# Patient Record
Sex: Male | Born: 1990 | Race: Black or African American | Hispanic: No | Marital: Single | Smoking: Current every day smoker
Health system: Southern US, Community
[De-identification: ages and names within clinical notes are randomized; demographics above are authoritative.]

## PROBLEM LIST (undated history)

## (undated) DIAGNOSIS — F419 Anxiety disorder, unspecified: Secondary | ICD-10-CM

## (undated) DIAGNOSIS — N159 Renal tubulo-interstitial disease, unspecified: Secondary | ICD-10-CM

## (undated) DIAGNOSIS — J3089 Other allergic rhinitis: Secondary | ICD-10-CM

## (undated) DIAGNOSIS — G9050 Complex regional pain syndrome I, unspecified: Secondary | ICD-10-CM

## (undated) HISTORY — PX: NO PAST SURGERIES: SHX2092

---
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
## 2012-04-04 ENCOUNTER — Emergency Department: Payer: Self-pay | Admitting: Emergency Medicine

## 2012-04-06 LAB — BETA STREP CULTURE(ARMC)

## 2012-09-19 ENCOUNTER — Emergency Department: Payer: Self-pay | Admitting: Emergency Medicine

## 2013-03-08 ENCOUNTER — Emergency Department: Payer: Self-pay | Admitting: Internal Medicine

## 2013-04-02 DIAGNOSIS — N159 Renal tubulo-interstitial disease, unspecified: Secondary | ICD-10-CM

## 2013-04-02 HISTORY — DX: Renal tubulo-interstitial disease, unspecified: N15.9

## 2013-12-30 ENCOUNTER — Emergency Department: Payer: Self-pay | Admitting: Emergency Medicine

## 2013-12-30 LAB — CBC WITH DIFFERENTIAL/PLATELET
BASOS PCT: 0.5 %
Basophil #: 0 10*3/uL (ref 0.0–0.1)
EOS ABS: 0.2 10*3/uL (ref 0.0–0.7)
Eosinophil %: 1.7 %
HCT: 50.1 % (ref 40.0–52.0)
HGB: 16.3 g/dL (ref 13.0–18.0)
LYMPHS PCT: 17.1 %
Lymphocyte #: 1.6 10*3/uL (ref 1.0–3.6)
MCH: 27.1 pg (ref 26.0–34.0)
MCHC: 32.6 g/dL (ref 32.0–36.0)
MCV: 83 fL (ref 80–100)
MONOS PCT: 11.7 %
Monocyte #: 1.1 x10 3/mm — ABNORMAL HIGH (ref 0.2–1.0)
NEUTROS ABS: 6.5 10*3/uL (ref 1.4–6.5)
Neutrophil %: 69 %
Platelet: 233 10*3/uL (ref 150–440)
RBC: 6.01 10*6/uL — ABNORMAL HIGH (ref 4.40–5.90)
RDW: 14.4 % (ref 11.5–14.5)
WBC: 9.5 10*3/uL (ref 3.8–10.6)

## 2013-12-30 LAB — COMPREHENSIVE METABOLIC PANEL
ALK PHOS: 79 U/L
ANION GAP: 6 — AB (ref 7–16)
Albumin: 4.2 g/dL (ref 3.4–5.0)
BILIRUBIN TOTAL: 0.5 mg/dL (ref 0.2–1.0)
BUN: 14 mg/dL (ref 7–18)
CHLORIDE: 109 mmol/L — AB (ref 98–107)
CO2: 26 mmol/L (ref 21–32)
Calcium, Total: 9.9 mg/dL (ref 8.5–10.1)
Creatinine: 1.68 mg/dL — ABNORMAL HIGH (ref 0.60–1.30)
EGFR (African American): >60
GFR CALC NON AF AMER: 56 — AB
GLUCOSE: 99 mg/dL (ref 65–99)
OSMOLALITY: 282 (ref 275–301)
POTASSIUM: 4.3 mmol/L (ref 3.5–5.1)
SGOT(AST): 36 U/L (ref 15–37)
SGPT (ALT): 42 U/L
Sodium: 141 mmol/L (ref 136–145)
Total Protein: 8 g/dL (ref 6.4–8.2)

## 2013-12-30 LAB — URINALYSIS, COMPLETE
Bacteria: NONE SEEN
Bilirubin,UR: NEGATIVE
Blood: NEGATIVE
Glucose,UR: NEGATIVE mg/dL (ref 0–75)
KETONE: NEGATIVE
LEUKOCYTE ESTERASE: NEGATIVE
NITRITE: NEGATIVE
PH: 6 (ref 4.5–8.0)
PROTEIN: NEGATIVE
RBC,UR: <1 /HPF (ref 0–5)
SQUAMOUS EPITHELIAL: NONE SEEN
Specific Gravity: 1.002 (ref 1.003–1.030)
WBC UR: NONE SEEN /HPF (ref 0–5)

## 2013-12-31 ENCOUNTER — Observation Stay: Payer: Self-pay | Admitting: Internal Medicine

## 2013-12-31 LAB — COMPREHENSIVE METABOLIC PANEL
ALBUMIN: 3.6 g/dL (ref 3.4–5.0)
AST: 25 U/L (ref 15–37)
Alkaline Phosphatase: 63 U/L
Anion Gap: 4 — ABNORMAL LOW (ref 7–16)
BUN: 13 mg/dL (ref 7–18)
Bilirubin,Total: 0.5 mg/dL (ref 0.2–1.0)
CHLORIDE: 107 mmol/L (ref 98–107)
CREATININE: 1.39 mg/dL — AB (ref 0.60–1.30)
Calcium, Total: 8.8 mg/dL (ref 8.5–10.1)
Co2: 29 mmol/L (ref 21–32)
EGFR (African American): >60
GLUCOSE: 97 mg/dL (ref 65–99)
OSMOLALITY: 279 (ref 275–301)
POTASSIUM: 3.8 mmol/L (ref 3.5–5.1)
SGPT (ALT): 30 U/L
SODIUM: 140 mmol/L (ref 136–145)
TOTAL PROTEIN: 6.9 g/dL (ref 6.4–8.2)

## 2013-12-31 LAB — CBC
HCT: 45.8 % (ref 40.0–52.0)
HGB: 15.2 g/dL (ref 13.0–18.0)
MCH: 27.8 pg (ref 26.0–34.0)
MCHC: 33.2 g/dL (ref 32.0–36.0)
MCV: 84 fL (ref 80–100)
Platelet: 218 10*3/uL (ref 150–440)
RBC: 5.47 10*6/uL (ref 4.40–5.90)
RDW: 14.3 % (ref 11.5–14.5)
WBC: 7.7 10*3/uL (ref 3.8–10.6)

## 2013-12-31 LAB — URINALYSIS, COMPLETE
BACTERIA: NONE SEEN
BILIRUBIN, UR: NEGATIVE
Blood: NEGATIVE
Glucose,UR: NEGATIVE mg/dL (ref 0–75)
KETONE: NEGATIVE
LEUKOCYTE ESTERASE: NEGATIVE
Nitrite: NEGATIVE
Ph: 5 (ref 4.5–8.0)
Protein: NEGATIVE
SPECIFIC GRAVITY: 1.015 (ref 1.003–1.030)

## 2013-12-31 LAB — LIPASE, BLOOD: Lipase: 69 U/L — ABNORMAL LOW (ref 73–393)

## 2014-01-01 LAB — CBC WITH DIFFERENTIAL/PLATELET
BASOS PCT: 0.7 %
Basophil #: 0 10*3/uL (ref 0.0–0.1)
Eosinophil #: 0.3 10*3/uL (ref 0.0–0.7)
Eosinophil %: 3.8 %
HCT: 44.8 % (ref 40.0–52.0)
HGB: 14.1 g/dL (ref 13.0–18.0)
Lymphocyte #: 3.4 10*3/uL (ref 1.0–3.6)
Lymphocyte %: 48.3 %
MCH: 26.8 pg (ref 26.0–34.0)
MCHC: 31.4 g/dL — AB (ref 32.0–36.0)
MCV: 85 fL (ref 80–100)
MONO ABS: 0.5 x10 3/mm (ref 0.2–1.0)
Monocyte %: 6.7 %
Neutrophil #: 2.8 10*3/uL (ref 1.4–6.5)
Neutrophil %: 40.5 %
PLATELETS: 202 10*3/uL (ref 150–440)
RBC: 5.26 10*6/uL (ref 4.40–5.90)
RDW: 14.4 % (ref 11.5–14.5)
WBC: 6.9 10*3/uL (ref 3.8–10.6)

## 2014-01-01 LAB — BASIC METABOLIC PANEL
Anion Gap: 5 — ABNORMAL LOW (ref 7–16)
BUN: 10 mg/dL (ref 7–18)
CALCIUM: 8.6 mg/dL (ref 8.5–10.1)
CO2: 29 mmol/L (ref 21–32)
CREATININE: 1.37 mg/dL — AB (ref 0.60–1.30)
Chloride: 107 mmol/L (ref 98–107)
EGFR (African American): >60
GLUCOSE: 81 mg/dL (ref 65–99)
OSMOLALITY: 279 (ref 275–301)
Potassium: 4 mmol/L (ref 3.5–5.1)
Sodium: 141 mmol/L (ref 136–145)

## 2014-01-01 LAB — URINE CULTURE

## 2014-01-01 LAB — LIPID PANEL
CHOLESTEROL: 111 mg/dL (ref 0–200)
HDL Cholesterol: 27 mg/dL — ABNORMAL LOW (ref 40–60)
Ldl Cholesterol, Calc: 65 mg/dL (ref 0–100)
Triglycerides: 93 mg/dL (ref 0–200)
VLDL CHOLESTEROL, CALC: 19 mg/dL (ref 5–40)

## 2014-01-01 LAB — HEMOGLOBIN A1C: Hemoglobin A1C: 6.1 % (ref 4.2–6.3)

## 2014-01-01 LAB — MAGNESIUM: Magnesium: 2 mg/dL

## 2014-01-02 LAB — URINE CULTURE

## 2014-05-09 ENCOUNTER — Emergency Department: Payer: Self-pay | Admitting: Emergency Medicine

## 2014-07-24 NOTE — H&P (Signed)
PATIENT NAME:  Jason Collier, Jason Collier MR#:  [id number] DATE OF BIRTH:  10-Nov-1990  DATE OF ADMISSION:  12/31/2013  PRIMARY CARE PHYSICIAN: Nonlocal.  REFERRING PHYSICIAN: Jolanta B. Pucilowska, MD  CHIEF COMPLAINT: Right flank pain with nausea and vomiting for 2 days.  HISTORY OF PRESENT ILLNESS: A 24 year old [place] American male with no past medical history who presented to the ED with the above chief complaint. The patient is alert, awake, oriented, in no acute distress. The patient said he started to have right-sided abdominal pain, radiation to the right flank and gradually right flank pain became worse. In addition, the patient has nausea and vomiting yesterday. The patient came to the ED. He got a CAT scan which showed pyelonephritis. However, the patient's urinalysis was negative. The patient was discharged with Cipro and oxycodone. However, after going home, the patient's right flank pain has become worse. In addition, the patient had multiple times of nausea, vomiting and unable to take p.o. medication, so the patient came back for further evaluation. Since the patient cannot take p.o. medication, Dr. Andi Devon admitted the patient for IV antibiotics and treatment.   PAST MEDICAL HISTORY: None.   PAST SURGICAL HISTORY: None.  FAMILY HISTORY: None.   ALLERGIES: None.   HOME MEDICATIONS: Oxycodone 5 mg p.o. every 6 hours p.r.n., Levaquin 750 mg p.o. daily.   REVIEW OF SYSTEMS: CONSTITUTIONAL: The patient has fever, chills. No headache or dizziness. No weakness.  EYES: No double vision or blurry vision.  EARS, NOSE AND THROAT: No postnasal drip, slurred speech or dysphagia.  CARDIOVASCULAR: No chest pain, palpitation, orthopnea or nocturnal dyspnea. No leg edema.  PULMONARY: No cough, sputum, shortness of breath or hemoptysis.  GASTROINTESTINAL: Positive for abdominal pain in the right flank pain and nausea, vomiting, but no diarrhea. No melena or bloody stool.  GENITOURINARY: No  dysuria, hematuria or incontinence.  SKIN: No rash or jaundice.  NEUROLOGIC: No syncope, loss of consciousness or seizure.  ENDOCRINE: No polyuria, polydipsia, heat or cold intolerance.  HEMATOLOGY: No easy bruising or bleeding.   PHYSICAL EXAMINATION:  VITAL SIGNS: Temperature is 97.6, blood pressure 125/73, pulse 52, oxygen saturation 100% on room air.  GENERAL: The patient is alert, awake, oriented, in no acute distress.  HEENT: Pupils are equal, reactive to light and accommodation. Moist oral mucosa. Clear oropharynx.  NECK: Supple. No JVD or carotid bruits. No lymphadenopathy. No thyromegaly.  CARDIOVASCULAR: S1, S2, regular rate and rhythm. No murmurs or gallops.  PULMONARY: Bilateral air entry. No wheezing or rales. No use of accessory muscle to breathe.  ABDOMEN: Soft. No distention. Some mild tenderness on the right middle part of the abdomen. No rigidity. No rebound. Right flank tenderness and right side of the back tenderness. No organomegaly.  EXTREMITIES: No edema, clubbing or cyanosis. No calf tenderness. Bilateral pedal pulses present.  SKIN: No rash or jaundice.  NEUROLOGY: A and O x 3. No focal deficit. Power 5/5. Sensation intact.   LABORATORY DATA: Urinalysis showed WBC 4, RBC less than 1, nitrite IS negative. Glucose 97, BUN 13, creatinine 1.39. Electrolytes normal. WBC 7.3, hemoglobin 15.2, platelets 218,000. Lipase 69.   CAT scan of abdomen and pelvis yesterday showed a subtle area of suspected nephrotic inhomogeneous raising question of infection or pyelonephritis.   Abdominal ultrasound showed no definite acute abnormality.   IMPRESSIONS:  1.  Questionable pyelonephritis.  2.  Acute kidney injury due to dehydration.   PLAN OF TREATMENT:  1.  The patient will be placed for observation. We  will start Rocephin IV with Zofran p.r.n. and pain medication p.r.n.  2.  Give IV fluid support. Follow up BMP.   I discussed the patient's condition and plan of treatment with  the patient.   TIME SPENT: About 52 minutes.    ____________________________ Shaune PollackQing Makella Buckingham, MD qc:TT D: 12/31/2013 21:53:13 ET T: 12/31/2013 22:40:31 ET JOB#: [id number]  cc: Shaune PollackQing Patton Swisher, MD, <Dictator> Shaune PollackQING Margarita Bobrowski MD ELECTRONICALLY SIGNED 01/01/2014 15:42

## 2014-07-24 NOTE — Discharge Summary (Signed)
PATIENT NAME:  Jason Collier, Jason Collier MR#:  [id number] DATE OF BIRTH:  03/09/1991  DATE OF ADMISSION:  12/31/2013 DATE OF DISCHARGE:  01/01/2014  DISCHARGE DIAGNOSES:  1.  Pyelonephritis.  2.  Ongoing tobacco abuse.  3.  Nausea and vomiting due to pyelonephritis.   CONSULTATIONS: None.   PROCEDURES: 1.  CT abdomen and pelvis performed 12/30/2013, shows subtle area of a suspected nephrographic inhomogeneity, raising question of infection/pyelonephritis.  2.  Ultrasound of the abdomen September 30 shows no definite acute abnormality in the right upper quadrant of the abdomen. Incidental note made of increasing echogenicity of  renal parenchyma suggesting medical renal disease.   HISTORY OF PRESENT ILLNESS: This very pleasant, 24 year old African American male with no past medical history presented to the Emergency Room with complaint of right flank pain, nausea, and vomiting for 2 days. He initially presented to the Emergency Room 2 days prior with right flank pain. A CT scan showed pyelonephritis suspected. He was discharged on levofloxacin and oxycodone. Upon going home, his flank pain became worse and he developed nausea and vomiting. He was unable to take his p.o. medications so he returns for further treatment.   HOSPITAL COURSE BY PROBLEM:  1.  Pyelonephritis: Unusual in that his urinalysis has been negative x 2 and urine cultures show 3000 colony-forming units of gram-positive cocci, colonies are too small to identify further. Repeat urine culture is negative. He seemed to respond well to 24 hours of IV Rocephin. Nausea was controlled with Zofran. His requirement for oxycodone was low. He is discharged on Ceftin and Zofran. Has a prior prescription for oxycodone at home and this was not repeated. He understands that she needs to follow up with a primary care physician due to this unusual presentation. He was given information on the [facility]. At the time of discharge, he is asymptomatic, no  pain, no nausea or vomiting.  2.  Ongoing tobacco abuse: Counseling was provided on smoking cessation. A nicotine patch was provided during hospitalization. The patient states that he is not ready to quit.  3.  Nausea and vomiting: This was likely due to #1 and has now resolved. The patient is tolerating full meals and has a prescription for Zofran in case symptoms recur.   DISCHARGE MEDICATIONS: 1.  Ceftin 250 mg 1 tablet orally twice a day for 8 days.  2.  Zofran 4 mg 1 tablet every 8 hours as needed for nausea and vomiting.  3.  Oxycodone 5 mg oral tablet 1 tablet every 6 hours as needed for pain. Note that this was not refilled during this admission, as he had a prior prescription from the Emergency Room visit 3 days before.   DISCHARGE PHYSICAL EXAMINATION: VITAL SIGNS: Temperature 98.3, heart rate 54, blood pressure 119/75, respirations 20, oxygenation 93% on room air.  GENERAL: Alert, oriented. No distress.  CARDIOVASCULAR: Regular rate and rhythm, no murmurs, rubs, or gallops.  PULMONARY: Lungs are clear to auscultation with good air movement.  ABDOMEN: Soft, nontender, nondistended. Bowel sounds are normal.  MUSCULOSKELETAL: There is some tenderness with percussion of the right costophrenic angle.  SKIN: No skin changes or rash noted.  EXTREMITIES: No edema, 2+ pulses.  NEUROLOGIC: Nonfocal.  PSYCHIATRIC: Alert, oriented, good insight.   LABORATORY DATA: Sodium 141, potassium 4.0, chloride 104, bicarbonate 29, BUN 10, creatinine 1.37. GFR is greater than 60. LDL is 65. Magnesium 2.0.  Total cholesterol 111, HDL 27, lipase 69. Hemoglobin A1c 6.1. LFTs are normal. White blood cell  count 6.9, hemoglobin 14.1, platelets 202,000, MCV 85. Urine culture is as described above. Urinalysis with 4 white blood cells per high-powered field.   CONDITION: Stable.   DISPOSITION: The patient is being discharged home.   DIET: No restrictions.   ACTIVITY: No restrictions.   FOLLOWUP:  The  patient is to follow up in the next month with [facility] or find another primary care physician.   TIME SPENT ON DISCHARGE: 35 minutes.    ____________________________ Ena Dawley. Clent Ridges, MD cpw:at D: 01/02/2014 11:36:14 ET T: 01/02/2014 19:26:24 ET JOB#: [id number]  cc: Ena Dawley. Clent Ridges, MD, <Dictator> Gale Journey MD ELECTRONICALLY SIGNED 01/06/2014 13:33

## 2014-08-08 ENCOUNTER — Other Ambulatory Visit: Payer: Self-pay

## 2014-08-08 ENCOUNTER — Emergency Department: Payer: 59

## 2014-08-08 ENCOUNTER — Emergency Department
Admission: EM | Admit: 2014-08-08 | Discharge: 2014-08-09 | Disposition: A | Discharge status: Home / Self Care | Payer: 59 | Attending: Emergency Medicine | Admitting: Emergency Medicine

## 2014-08-08 ENCOUNTER — Encounter: Payer: Self-pay | Admitting: *Deleted

## 2014-08-08 DIAGNOSIS — M549 Dorsalgia, unspecified: Secondary | ICD-10-CM | POA: Diagnosis not present

## 2014-08-08 DIAGNOSIS — M7918 Myalgia, other site: Secondary | ICD-10-CM

## 2014-08-08 DIAGNOSIS — R109 Unspecified abdominal pain: Secondary | ICD-10-CM | POA: Insufficient documentation

## 2014-08-08 DIAGNOSIS — Z720 Tobacco use: Secondary | ICD-10-CM | POA: Insufficient documentation

## 2014-08-08 LAB — CBC WITH DIFFERENTIAL/PLATELET
BASOS ABS: 0 10*3/uL (ref 0–0.1)
Basophils Relative: 1 %
Eosinophils Absolute: 0.1 10*3/uL (ref 0–0.7)
Eosinophils Relative: 3 %
HCT: 46.5 % (ref 40.0–52.0)
HEMOGLOBIN: 15.7 g/dL (ref 13.0–18.0)
Lymphocytes Relative: 48 %
Lymphs Abs: 2.8 10*3/uL (ref 1.0–3.6)
MCH: 28.2 pg (ref 26.0–34.0)
MCHC: 33.8 g/dL (ref 32.0–36.0)
MCV: 83.3 fL (ref 80.0–100.0)
MONOS PCT: 8 %
Monocytes Absolute: 0.5 10*3/uL (ref 0.2–1.0)
NEUTROS ABS: 2.3 10*3/uL (ref 1.4–6.5)
Neutrophils Relative %: 40 %
PLATELETS: 221 10*3/uL (ref 150–440)
RBC: 5.58 MIL/uL (ref 4.40–5.90)
RDW: 15.9 % — ABNORMAL HIGH (ref 11.5–14.5)
WBC: 5.8 10*3/uL (ref 3.8–10.6)

## 2014-08-08 LAB — LIPASE, BLOOD: LIPASE: 23 U/L (ref 22–51)

## 2014-08-08 LAB — COMPREHENSIVE METABOLIC PANEL
ALBUMIN: 4.5 g/dL (ref 3.5–5.0)
ALK PHOS: 62 U/L (ref 38–126)
ALT: 37 U/L (ref 17–63)
ANION GAP: 9 (ref 5–15)
AST: 30 U/L (ref 15–41)
BUN: 13 mg/dL (ref 6–20)
CO2: 25 mmol/L (ref 22–32)
Calcium: 9.6 mg/dL (ref 8.9–10.3)
Chloride: 107 mmol/L (ref 101–111)
Creatinine, Ser: 1.18 mg/dL (ref 0.61–1.24)
GFR calc non Af Amer: >60 mL/min (ref >60)
Glucose, Bld: 92 mg/dL (ref 65–99)
POTASSIUM: 4.2 mmol/L (ref 3.5–5.1)
SODIUM: 141 mmol/L (ref 135–145)
Total Bilirubin: 0.5 mg/dL (ref 0.3–1.2)
Total Protein: 7.6 g/dL (ref 6.5–8.1)

## 2014-08-08 LAB — TROPONIN I: Troponin I: <0.03 ng/mL (ref <0.031)

## 2014-08-08 MED ORDER — ONDANSETRON HCL 4 MG/2ML IJ SOLN
INTRAMUSCULAR | Status: AC
  Administered 2014-08-08: 4 mg via INTRAVENOUS
  Filled 2014-08-08: qty 2

## 2014-08-08 MED ORDER — IOHEXOL 300 MG/ML  SOLN
100.0000 mL | Freq: Once | INTRAMUSCULAR | Status: AC | PRN
  Administered 2014-08-08: 100 mL via INTRAVENOUS

## 2014-08-08 MED ORDER — HYDROMORPHONE HCL 1 MG/ML IJ SOLN
1.0000 mg | Freq: Once | INTRAMUSCULAR | Status: AC
Start: 2014-08-08 — End: 2014-08-08
  Administered 2014-08-08: 1 mg via INTRAVENOUS

## 2014-08-08 MED ORDER — HYDROMORPHONE HCL 1 MG/ML IJ SOLN
INTRAMUSCULAR | Status: AC
  Filled 2014-08-08: qty 1

## 2014-08-08 MED ORDER — SODIUM CHLORIDE 0.9 % IV BOLUS (SEPSIS)
1000.0000 mL | Freq: Once | INTRAVENOUS | Status: AC
  Administered 2014-08-08: 1000 mL via INTRAVENOUS

## 2014-08-08 MED ORDER — OXYCODONE-ACETAMINOPHEN 5-325 MG PO TABS
ORAL_TABLET | ORAL | Status: AC
  Administered 2014-08-08: 1 via ORAL
  Filled 2014-08-08: qty 1

## 2014-08-08 MED ORDER — IOHEXOL 240 MG/ML SOLN
25.0000 mL | Freq: Once | INTRAMUSCULAR | Status: AC | PRN
  Administered 2014-08-08: 25 mL via ORAL

## 2014-08-08 MED ORDER — OXYCODONE-ACETAMINOPHEN 5-325 MG PO TABS
1.0000 | ORAL_TABLET | Freq: Once | ORAL | Status: AC
Start: 2014-08-08 — End: 2014-08-08
  Administered 2014-08-08: 1 via ORAL

## 2014-08-08 NOTE — ED Notes (Signed)
Pt states R back/flank pain x 1 week, worsening today at 1830. Pt states pain radiating from R mid-back to R chest/RUQ.  Pt denies urinary sxs.

## 2014-08-09 LAB — URINALYSIS COMPLETE WITH MICROSCOPIC (ARMC ONLY)
BACTERIA UA: NONE SEEN
Bilirubin Urine: NEGATIVE
GLUCOSE, UA: NEGATIVE mg/dL
Hgb urine dipstick: NEGATIVE
Ketones, ur: NEGATIVE mg/dL
LEUKOCYTES UA: NEGATIVE
NITRITE: NEGATIVE
Protein, ur: NEGATIVE mg/dL
RBC / HPF: NONE SEEN RBC/hpf (ref 0–5)
SPECIFIC GRAVITY, URINE: 1.056 — AB (ref 1.005–1.030)
pH: 5 (ref 5.0–8.0)

## 2014-08-09 MED ORDER — OXYCODONE-ACETAMINOPHEN 5-325 MG PO TABS: 1.0000 | ORAL_TABLET | Freq: Four times a day (QID) | ORAL | Status: DC | PRN

## 2014-08-09 MED ORDER — NAPROXEN 500 MG PO TABS: 500.0000 mg | ORAL_TABLET | Freq: Two times a day (BID) | ORAL | Status: DC

## 2014-08-09 MED ORDER — OXYCODONE-ACETAMINOPHEN 5-325 MG PO TABS: 1.0000 | ORAL_TABLET | Freq: Once | ORAL | Status: DC

## 2014-08-09 MED ORDER — OXYCODONE-ACETAMINOPHEN 5-325 MG PO TABS
1.0000 | ORAL_TABLET | Freq: Once | ORAL | Status: AC
  Administered 2014-08-09: 1 via ORAL

## 2014-08-09 MED ORDER — OXYCODONE-ACETAMINOPHEN 5-325 MG PO TABS
ORAL_TABLET | ORAL | Status: AC
  Filled 2014-08-09: qty 1

## 2014-08-09 NOTE — Discharge Instructions (Signed)
Musculoskeletal Pain Musculoskeletal pain is muscle and boney aches and pains. These pains can occur in any part of the body. Your caregiver may treat you without knowing the cause of the pain. They may treat you if blood or urine tests, X-rays, and other tests were normal.  CAUSES There is often not a definite cause or reason for these pains. These pains may be caused by a type of germ (virus). The discomfort may also come from overuse. Overuse includes working out too hard when your body is not fit. Boney aches also come from weather changes. Bone is sensitive to atmospheric pressure changes. HOME CARE INSTRUCTIONS   Ask when your test results will be ready. Make sure you get your test results.  Only take over-the-counter or prescription medicines for pain, discomfort, or fever as directed by your caregiver. If you were given medications for your condition, do not drive, operate machinery or power tools, or sign legal documents for 24 hours. Do not drink alcohol. Do not take sleeping pills or other medications that may interfere with treatment.  Continue all activities unless the activities cause more pain. When the pain lessens, slowly resume normal activities. Gradually increase the intensity and duration of the activities or exercise.  During periods of severe pain, bed rest may be helpful. Lay or sit in any position that is comfortable.  Putting ice on the injured area.  Put ice in a bag.  Place a towel between your skin and the bag.  Leave the ice on for 15 to 20 minutes, 3 to 4 times a day.  Follow up with your caregiver for continued problems and no reason can be found for the pain. If the pain becomes worse or does not go away, it may be necessary to repeat tests or do additional testing. Your caregiver may need to look further for a possible cause. SEEK IMMEDIATE MEDICAL CARE IF:  You have pain that is getting worse and is not relieved by medications.  You develop chest pain  that is associated with shortness or breath, sweating, feeling sick to your stomach (nauseous), or throw up (vomit).  Your pain becomes localized to the abdomen.  You develop any new symptoms that seem different or that concern you. MAKE SURE YOU:   Understand these instructions.  Will watch your condition.  Will get help right away if you are not doing well or get worse. Document Released: 03/19/2005 Document Revised: 06/11/2011 Document Reviewed: 11/21/2012 [facility] Patient Information 2015 [employer], [place]. This information is not intended to replace advice given to you by your health care provider. Make sure you discuss any questions you have with your health care provider.  Your blood and urine tests and CT scan of the abdomen were unremarkable. Take pain medication as prescribed and follow-up with her doctor for continued monitoring of your symptoms.  Take Percocet as prescribed. Do not drink alcohol, drive or participate in any other potentially dangerous activities while taking this medication as it may make you sleepy. Do not take this medication with any other sedating medications, either prescription or over-the-counter. If you were prescribed Percocet or Vicodin, do not take these with acetaminophen (Tylenol) as it is already contained within these medications.   This medication is an opiate (or narcotic) pain medication and can be habit forming.  Use it as little as possible to achieve adequate pain control.  Do not use or use it with extreme caution if you have a history of opiate abuse or dependence.  If you are on a pain contract with your primary care doctor or a pain specialist, be sure to let them know you were prescribed this medication today from the [facility].  This medication is intended for your use only - do not give any to anyone else and keep it in a secure place where nobody else, especially children, have access to it.  It will also cause or  worsen constipation, so you may want to consider taking an over-the-counter stool softener while you are taking this medication.

## 2014-08-09 NOTE — ED Provider Notes (Signed)
[facility] Emergency Department Provider Note  ____________________________________________  Time seen: 8:35 PM  I have reviewed the triage vital signs and the nursing notes.   HISTORY  Chief Complaint Flank Pain    HPI Jason Collier is a 24 y.o. male who complains of severe right flank pain radiating up his back and to his right upper quadrant and into the rest of his abdomen. He felt very anxious and short of breath at home and was noted to be hyperventilating by EMS to the point that he passed out. He reports never having pain this severe, but in the past. He had pyelonephritis which did give him something similar. He denies any dysuria or hematuria. No fever, chills, nausea, vomiting, chest pain. He is eating normally. He had a big lunch today at a cookout and shortly afterwards started having abdominal pain. He reports the pain is sharp, constant, severe, no aggravating or alleviating factors. He does exercise frequently but does not lift weights. Mostly does pushups and sit-ups     History reviewed. No pertinent past medical history.  There are no active problems to display for this patient.   History reviewed. No pertinent past surgical history.  Current Outpatient Rx  Name  Route  Sig  Dispense  Refill  . ibuprofen (ADVIL,MOTRIN) 200 MG tablet   Oral   Take 200 mg by mouth every 6 (six) hours as needed for moderate pain.         . naproxen (NAPROSYN) 500 MG tablet   Oral   Take 1 tablet (500 mg total) by mouth 2 (two) times daily with a meal.   20 tablet   0   . oxyCODONE-acetaminophen (ROXICET) 5-325 MG per tablet   Oral   Take 1 tablet by mouth every 6 (six) hours as needed for severe pain.   10 tablet   0     Allergies Review of patient's allergies indicates no known allergies.  History reviewed. No pertinent family history.  Social History History  Substance Use Topics  . Smoking status: Current Every Day Smoker   Types: Cigarettes  . Smokeless tobacco: Former NeurosurgeonUser  . Alcohol Use: 0.6 oz/week    1 Cans of beer per week     Comment: every three days    Review of Systems  Constitutional: No fever or chills. No weight changes Eyes:No blurry vision or double vision.  ENT: No sore throat. Cardiovascular: No chest pain. Respiratory: No dyspnea or cough. Gastrointestinal: No vomiting or diarrhea  No BRBPR or melena. Genitourinary: Negative for dysuria, urinary retention, bloody urine, or difficulty urinating. Musculoskeletal: Back and flank pain as above Skin: Negative for rash. Neurological: Negative for headaches, focal weakness or numbness. Psychiatric:No anxiety or depression.   Endocrine:No hot/cold intolerance, changes in energy, or sleep difficulty.  10-point ROS otherwise negative.  ____________________________________________   PHYSICAL EXAM:  VITAL SIGNS: ED Triage Vitals  Enc Vitals Group     BP 08/08/14 2003 160/80 mmHg     Pulse Rate 08/08/14 2003 89     Resp 08/08/14 2003 20     Temp 08/08/14 2003 98.5 F (36.9 C)     Temp Source 08/08/14 2003 Oral     SpO2 08/08/14 2003 98 %     Weight 08/08/14 2003 240 lb (108.863 kg)     Height 08/08/14 2003 5\' 11"  (1.803 m)     Head Cir --      Peak Flow --  Pain Score 08/08/14 2004 10     Pain Loc --      Pain Edu? --      Excl. in GC? --      Constitutional: Alert and oriented. Well appearing and in no distress. Eyes: No scleral icterus. No conjunctival pallor. PERRL. EOMI ENT   Head: Normocephalic and atraumatic.   Nose: No congestion/rhinnorhea. No septal hematoma   Mouth/Throat: MMM, no pharyngeal erythema   Neck: No stridor. No SubQ emphysema.  Hematological/Lymphatic/Immunilogical: No cervical lymphadenopathy. Cardiovascular: RRR. Normal and symmetric distal pulses are present in all extremities. No murmurs, rubs, or gallops. Respiratory: Normal respiratory effort without tachypnea nor retractions.  Breath sounds are clear and equal bilaterally. No wheezes/rales/rhonchi. Gastrointestinal: Right lower quadrant tenderness No distention. There is no CVA tenderness.  No rebound, rigidity, or guarding. Genitourinary: deferred Musculoskeletal: Exquisite tenderness of the right paraspinous musculature in the flank and right lower thorax to even light palpation. Also, there is tenderness over the right anterior inferior ribs. Neurologic:   Normal speech and language.  CN 2-10 normal. Motor grossly intact. No pronator drift.  Normal gait. No gross focal neurologic deficits are appreciated.  Skin:  Skin is warm, dry and intact. No rash noted.  No petechiae, purpura, or bullae. Psychiatric: Mood and affect are normal. Speech and behavior are normal. Patient exhibits appropriate insight and judgment.  ____________________________________________    LABS (pertinent positives/negatives) (all labs ordered are listed, but only abnormal results are displayed) Labs Reviewed  CBC WITH DIFFERENTIAL/PLATELET - Abnormal; Notable for the following:    RDW 15.9 (*)    All other components within normal limits  URINALYSIS COMPLETEWITH MICROSCOPIC ([facility])  - Abnormal; Notable for the following:    Color, Urine STRAW (*)    APPearance CLEAR (*)    Specific Gravity, Urine 1.056 (*)    Squamous Epithelial / LPF 0-5 (*)    All other components within normal limits  COMPREHENSIVE METABOLIC PANEL  LIPASE, BLOOD  TROPONIN I   ____________________________________________   EKG  Date: 08/09/2014  Rate: 74  Rhythm: normal sinus rhythm  QRS Axis: normal  Intervals: normal  ST/T Wave abnormalities: normal  Conduction Disutrbances: none  Narrative Interpretation: unremarkable      ____________________________________________    RADIOLOGY  CT abdomen and pelvis unremarkable  ____________________________________________   PROCEDURES  ____________________________________________   INITIAL  IMPRESSION / ASSESSMENT AND PLAN / ED COURSE  Pertinent labs & imaging results that were available during my care of the patient were reviewed by me and considered in my medical decision making (see chart for details).  Patient presents with severe pain in multiple locations, which is present with even very minimal palpation. This is most consistent with muscular skeletal pain. However, with his right lower quadrant tenderness. We'll also get a CT scan of the abdomen to rule out appendicitis. We'll control his symptoms with IV Dilaudid.  ----------------------------------------- 12:23 AM on 08/09/2014 -----------------------------------------  Pain is controlled. Workup is unremarkable. Discussed the patient's muscular skeletal pain at length. I have low suspicion of any other acute pathology or surgical abdomen. Given the constellation of symptoms and the patient's overall anxious appearance, I suspect that the current presentation may be related to a panic attack. Low suspicion of ACS, PE TAD carditis, pneumothorax or pneumonia.  ____________________________________________   FINAL CLINICAL IMPRESSION(S) / ED DIAGNOSES  Final diagnoses:  Musculoskeletal pain      Sharman CheekPhillip Varetta Chavers, MD 08/09/14 [phone]

## 2014-10-14 ENCOUNTER — Emergency Department: Payer: 59

## 2014-10-14 ENCOUNTER — Other Ambulatory Visit: Payer: Self-pay

## 2014-10-14 ENCOUNTER — Emergency Department
Admission: EM | Admit: 2014-10-14 | Discharge: 2014-10-14 | Disposition: A | Discharge status: Home / Self Care | Payer: 59 | Attending: Emergency Medicine | Admitting: Emergency Medicine

## 2014-10-14 ENCOUNTER — Encounter: Payer: Self-pay | Admitting: Emergency Medicine

## 2014-10-14 DIAGNOSIS — R197 Diarrhea, unspecified: Secondary | ICD-10-CM

## 2014-10-14 DIAGNOSIS — R1084 Generalized abdominal pain: Secondary | ICD-10-CM | POA: Insufficient documentation

## 2014-10-14 DIAGNOSIS — Z720 Tobacco use: Secondary | ICD-10-CM | POA: Insufficient documentation

## 2014-10-14 DIAGNOSIS — R112 Nausea with vomiting, unspecified: Secondary | ICD-10-CM

## 2014-10-14 LAB — CBC WITH DIFFERENTIAL/PLATELET
BASOS PCT: 1 %
Basophils Absolute: 0.1 10*3/uL (ref 0–0.1)
EOS ABS: 0.1 10*3/uL (ref 0–0.7)
Eosinophils Relative: 2 %
HCT: 47 % (ref 40.0–52.0)
HEMOGLOBIN: 15.9 g/dL (ref 13.0–18.0)
LYMPHS ABS: 2.2 10*3/uL (ref 1.0–3.6)
Lymphocytes Relative: 32 %
MCH: 28.1 pg (ref 26.0–34.0)
MCHC: 33.9 g/dL (ref 32.0–36.0)
MCV: 82.8 fL (ref 80.0–100.0)
MONOS PCT: 7 %
Monocytes Absolute: 0.4 10*3/uL (ref 0.2–1.0)
Neutro Abs: 3.9 10*3/uL (ref 1.4–6.5)
Neutrophils Relative %: 58 %
Platelets: 197 10*3/uL (ref 150–440)
RBC: 5.68 MIL/uL (ref 4.40–5.90)
RDW: 14.7 % — AB (ref 11.5–14.5)
WBC: 6.7 10*3/uL (ref 3.8–10.6)

## 2014-10-14 LAB — URINALYSIS COMPLETE WITH MICROSCOPIC (ARMC ONLY)
BACTERIA UA: NONE SEEN
Bilirubin Urine: NEGATIVE
Glucose, UA: NEGATIVE mg/dL
Hgb urine dipstick: NEGATIVE
Ketones, ur: NEGATIVE mg/dL
Leukocytes, UA: NEGATIVE
Nitrite: NEGATIVE
Protein, ur: NEGATIVE mg/dL
RBC / HPF: NONE SEEN RBC/hpf (ref 0–5)
SQUAMOUS EPITHELIAL / LPF: NONE SEEN
Specific Gravity, Urine: 1.019 (ref 1.005–1.030)
pH: 5 (ref 5.0–8.0)

## 2014-10-14 LAB — COMPREHENSIVE METABOLIC PANEL
ALBUMIN: 4.6 g/dL (ref 3.5–5.0)
ALT: 38 U/L (ref 17–63)
ANION GAP: 10 (ref 5–15)
AST: 29 U/L (ref 15–41)
Alkaline Phosphatase: 52 U/L (ref 38–126)
BUN: 8 mg/dL (ref 6–20)
CALCIUM: 9.6 mg/dL (ref 8.9–10.3)
CO2: 25 mmol/L (ref 22–32)
Chloride: 102 mmol/L (ref 101–111)
Creatinine, Ser: 1.11 mg/dL (ref 0.61–1.24)
GFR calc Af Amer: >60 mL/min (ref >60)
Glucose, Bld: 87 mg/dL (ref 65–99)
Potassium: 4.5 mmol/L (ref 3.5–5.1)
SODIUM: 137 mmol/L (ref 135–145)
Total Bilirubin: 0.8 mg/dL (ref 0.3–1.2)
Total Protein: 7.8 g/dL (ref 6.5–8.1)

## 2014-10-14 LAB — LIPASE, BLOOD: Lipase: 12 U/L — ABNORMAL LOW (ref 22–51)

## 2014-10-14 LAB — TROPONIN I

## 2014-10-14 MED ORDER — MORPHINE SULFATE 4 MG/ML IJ SOLN
4.0000 mg | Freq: Once | INTRAMUSCULAR | Status: AC
  Administered 2014-10-14: 4 mg via INTRAVENOUS
  Filled 2014-10-14: qty 1

## 2014-10-14 MED ORDER — SODIUM CHLORIDE 0.9 % IV BOLUS (SEPSIS)
1000.0000 mL | Freq: Once | INTRAVENOUS | Status: AC
  Administered 2014-10-14: 1000 mL via INTRAVENOUS
  Filled 2014-10-14: qty 1000

## 2014-10-14 MED ORDER — FAMOTIDINE IN NACL 20-0.9 MG/50ML-% IV SOLN
20.0000 mg | Freq: Once | INTRAVENOUS | Status: AC
  Administered 2014-10-14: 20 mg via INTRAVENOUS
  Filled 2014-10-14: qty 50

## 2014-10-14 MED ORDER — ONDANSETRON HCL 4 MG/2ML IJ SOLN
4.0000 mg | Freq: Once | INTRAMUSCULAR | Status: AC
  Administered 2014-10-14: 4 mg via INTRAVENOUS
  Filled 2014-10-14: qty 2

## 2014-10-14 MED ORDER — ONDANSETRON HCL 4 MG PO TABS: 4.0000 mg | ORAL_TABLET | Freq: Every day | ORAL | Status: DC | PRN

## 2014-10-14 NOTE — Discharge Instructions (Signed)

## 2014-10-14 NOTE — ED Notes (Signed)
Reports n/v and bodyaches since yesterday

## 2014-10-14 NOTE — ED Provider Notes (Addendum)
[facility] Emergency Department Provider Note  ____________________________________________  Time seen: Approximately 4:20 PM  I have reviewed the triage vital signs and the nursing notes.   HISTORY  Chief Complaint Emesis    HPI Jason Collier is a 24 y.o. male with previous history of pyelonephritis and right-sided abdominal and back pain who presents today with vomiting since 7 AM. He said that the vomit was green at first and then progressed to clear. He says that he has been having diarrhea but no blood in his stool. He denies any sick contacts. Says that he had a similar episode this past May. Also had an episode of pyelonephritis with similar symptoms in the past. He denies any urinary frequency or burning at this time. Says his abdominal pain is diffuse, constant, severe and sharp. He says he also feels chest pressure. Denies any shortness of breath.No cardiac disease at a young age in the family. No history of kidney stones in the family.   History reviewed. No pertinent past medical history.  There are no active problems to display for this patient.   History reviewed. No pertinent past surgical history.  Current Outpatient Rx  Name  Route  Sig  Dispense  Refill  . ibuprofen (ADVIL,MOTRIN) 200 MG tablet   Oral   Take 200 mg by mouth every 6 (six) hours as needed for moderate pain.         . naproxen (NAPROSYN) 500 MG tablet   Oral   Take 1 tablet (500 mg total) by mouth 2 (two) times daily with a meal.   20 tablet   0   . oxyCODONE-acetaminophen (ROXICET) 5-325 MG per tablet   Oral   Take 1 tablet by mouth every 6 (six) hours as needed for severe pain.   10 tablet   0     Allergies Review of patient's allergies indicates no known allergies.  History reviewed. No pertinent family history.  Social History History  Substance Use Topics  . Smoking status: Current Every Day Smoker    Types: Cigarettes  . Smokeless tobacco:  Former Neurosurgeon  . Alcohol Use: 0.6 oz/week    1 Cans of beer per week     Comment: every three days    Review of Systems Constitutional: No fever/chills Eyes: No visual changes. ENT: No sore throat. Cardiovascular: As above  Respiratory: Denies shortness of breath. Gastrointestinal:  No constipation. Genitourinary: Negative for dysuria. Musculoskeletal: Negative for back pain. Skin: Negative for rash. Neurological: Negative for headaches, focal weakness or numbness.  10-point ROS otherwise negative.  ____________________________________________   PHYSICAL EXAM:  VITAL SIGNS: ED Triage Vitals  Enc Vitals Group     BP 10/14/14 1610 138/71 mmHg     Pulse Rate 10/14/14 1610 92     Resp 10/14/14 1610 20     Temp 10/14/14 1610 98.3 F (36.8 C)     Temp Source 10/14/14 1610 Oral     SpO2 10/14/14 1610 99 %     Weight 10/14/14 1610 235 lb (106.595 kg)     Height 10/14/14 1610  (1.803 m)     Head Cir --      Peak Flow --      Pain Score 10/14/14 1611 10     Pain Loc --      Pain Edu? --      Excl. in GC? --     Constitutional: Alert and oriented. Constant over on the bed holding stomach.  Eyes: Conjunctivae are normal. PERRL. EOMI. Head: Atraumatic. Nose: No congestion/rhinnorhea. Mouth/Throat: Mucous membranes are moist.  Oropharynx non-erythematous. Neck: No stridor.   Cardiovascular: Normal rate, regular rhythm. Grossly normal heart sounds.  Good peripheral circulation. Respiratory: Normal respiratory effort.  No retractions. Lungs CTAB. Gastrointestinal: Soft with diffuse tenderness worse in the right upper quadrant. No distention. No abdominal bruits. Right-sided CVA tenderness. Musculoskeletal: No lower extremity tenderness nor edema.  No joint effusions. Neurologic:  Normal speech and language. No gross focal neurologic deficits are appreciated. No gait instability. Skin:  Skin is warm, dry and intact. No rash noted. Psychiatric: Mood and affect are normal.  Speech and behavior are normal.  ____________________________________________   LABS (all labs ordered are listed, but only abnormal results are displayed)  Labs Reviewed  CBC WITH DIFFERENTIAL/PLATELET - Abnormal; Notable for the following:    RDW 14.7 (*)    All other components within normal limits  LIPASE, BLOOD - Abnormal; Notable for the following:    Lipase 12 (*)    All other components within normal limits  URINALYSIS COMPLETEWITH MICROSCOPIC ([facility] ONLY) - Abnormal; Notable for the following:    Color, Urine YELLOW (*)    APPearance CLEAR (*)    All other components within normal limits  COMPREHENSIVE METABOLIC PANEL  TROPONIN I   ____________________________________________  EKG  ED ECG REPORT I, Arelia LongestSchaevitz,  Octavion Mollenkopf M, the attending physician, personally viewed and interpreted this ECG.   Date: 10/14/2014  EKG Time: 1712  Rate: 73  Rhythm: normal sinus rhythm with sinus arrhythmia.  Axis: Normal axis  Intervals:none  ST&T Change: No ST elevations or depressions. No abnormal T-wave inversions.  ____________________________________________  RADIOLOGY  No active cardiac disease. I personally reviewed this x-ray. ____________________________________________   PROCEDURES    ____________________________________________   INITIAL IMPRESSION / ASSESSMENT AND PLAN / ED COURSE  Pertinent labs & imaging results that were available during my care of the patient were reviewed by me and considered in my medical decision making (see chart for details).  ----------------------------------------- 7:19 PM on 10/14/2014 -----------------------------------------  Patient is resting comfortably at this time. He says that his chest pain and belly pain or relief but he still has right flank pain. He has reassuring labs as well as EKG and chest x-ray. He is also not vomited after antibiotics. I asked him if he has done any heavy lifting recently and he denies. He had a  recent visit in May where he had a CAT scan of his abdomen and pelvis which was negative for any acute pathology. Given muscle relaxers at his previous visit and denies than having any relief. Says will try Tylenol and ibuprofen at home. Will DC home with Zofran. Patient does not have primary care follow-up. We'll give number for follow-up. Unclear etiology of pain. Also is PERC negative.   ____________________________________________   FINAL CLINICAL IMPRESSION(S) / ED DIAGNOSES   Acute abdominal and right flank pain. Acute nausea vomiting and diarrhea.  Return visit.  Myrna Blazeravid Matthew Tran Randle, MD 10/14/14 [phone]  Will not proceed with abdominal imaging because of recent CAT scan and also reassuring labs. Also denies any blood in his stool.  Myrna Blazeravid Matthew Waynetta Metheny, MD 10/14/14 [phone]

## 2014-11-25 ENCOUNTER — Other Ambulatory Visit: Payer: Self-pay

## 2014-11-25 ENCOUNTER — Emergency Department
Admission: EM | Admit: 2014-11-25 | Discharge: 2014-11-25 | Disposition: A | Discharge status: Home / Self Care | Payer: 59 | Attending: Emergency Medicine | Admitting: Emergency Medicine

## 2014-11-25 ENCOUNTER — Emergency Department: Payer: 59

## 2014-11-25 ENCOUNTER — Encounter: Payer: Self-pay | Admitting: Emergency Medicine

## 2014-11-25 DIAGNOSIS — Z720 Tobacco use: Secondary | ICD-10-CM | POA: Insufficient documentation

## 2014-11-25 DIAGNOSIS — M549 Dorsalgia, unspecified: Secondary | ICD-10-CM | POA: Insufficient documentation

## 2014-11-25 DIAGNOSIS — R109 Unspecified abdominal pain: Secondary | ICD-10-CM

## 2014-11-25 DIAGNOSIS — R1011 Right upper quadrant pain: Secondary | ICD-10-CM | POA: Insufficient documentation

## 2014-11-25 LAB — LIPASE, BLOOD: LIPASE: 18 U/L — AB (ref 22–51)

## 2014-11-25 LAB — COMPREHENSIVE METABOLIC PANEL
ALBUMIN: 4.2 g/dL (ref 3.5–5.0)
ALK PHOS: 59 U/L (ref 38–126)
ALT: 36 U/L (ref 17–63)
ANION GAP: 9 (ref 5–15)
AST: 35 U/L (ref 15–41)
BUN: 10 mg/dL (ref 6–20)
CO2: 24 mmol/L (ref 22–32)
Calcium: 9.3 mg/dL (ref 8.9–10.3)
Chloride: 107 mmol/L (ref 101–111)
Creatinine, Ser: 1.13 mg/dL (ref 0.61–1.24)
GFR calc Af Amer: >60 mL/min (ref >60)
GFR calc non Af Amer: >60 mL/min (ref >60)
GLUCOSE: 103 mg/dL — AB (ref 65–99)
POTASSIUM: 4.2 mmol/L (ref 3.5–5.1)
SODIUM: 140 mmol/L (ref 135–145)
Total Bilirubin: 0.3 mg/dL (ref 0.3–1.2)
Total Protein: 7.4 g/dL (ref 6.5–8.1)

## 2014-11-25 LAB — URINALYSIS COMPLETE WITH MICROSCOPIC (ARMC ONLY)
BACTERIA UA: NONE SEEN
Bilirubin Urine: NEGATIVE
GLUCOSE, UA: NEGATIVE mg/dL
HGB URINE DIPSTICK: NEGATIVE
Ketones, ur: NEGATIVE mg/dL
Leukocytes, UA: NEGATIVE
Nitrite: NEGATIVE
Protein, ur: NEGATIVE mg/dL
Specific Gravity, Urine: 1.013 (ref 1.005–1.030)
pH: 6 (ref 5.0–8.0)

## 2014-11-25 LAB — CBC WITH DIFFERENTIAL/PLATELET
Basophils Absolute: 0.2 10*3/uL — ABNORMAL HIGH (ref 0–0.1)
Basophils Relative: 2 %
Eosinophils Absolute: 0.3 10*3/uL (ref 0–0.7)
Eosinophils Relative: 3 %
HEMATOCRIT: 46 % (ref 40.0–52.0)
Hemoglobin: 15.3 g/dL (ref 13.0–18.0)
LYMPHS PCT: 30 %
Lymphs Abs: 2.9 10*3/uL (ref 1.0–3.6)
MCH: 28.2 pg (ref 26.0–34.0)
MCHC: 33.4 g/dL (ref 32.0–36.0)
MCV: 84.6 fL (ref 80.0–100.0)
MONO ABS: 0.6 10*3/uL (ref 0.2–1.0)
MONOS PCT: 6 %
NEUTROS ABS: 5.8 10*3/uL (ref 1.4–6.5)
Neutrophils Relative %: 59 %
Platelets: 222 10*3/uL (ref 150–440)
RBC: 5.43 MIL/uL (ref 4.40–5.90)
RDW: 14.4 % (ref 11.5–14.5)
WBC: 9.7 10*3/uL (ref 3.8–10.6)

## 2014-11-25 MED ORDER — HYDROCODONE-ACETAMINOPHEN 5-325 MG PO TABS: 1.0000 | ORAL_TABLET | Freq: Four times a day (QID) | ORAL | Status: DC | PRN

## 2014-11-25 MED ORDER — CYCLOBENZAPRINE HCL 10 MG PO TABS: 10.0000 mg | ORAL_TABLET | Freq: Three times a day (TID) | ORAL | Status: DC | PRN

## 2014-11-25 MED ORDER — SODIUM CHLORIDE 0.9 % IV BOLUS (SEPSIS)
1000.0000 mL | Freq: Once | INTRAVENOUS | Status: AC
  Administered 2014-11-25: 1000 mL via INTRAVENOUS

## 2014-11-25 MED ORDER — ONDANSETRON HCL 4 MG/2ML IJ SOLN
4.0000 mg | Freq: Once | INTRAMUSCULAR | Status: AC
  Administered 2014-11-25: 4 mg via INTRAVENOUS
  Filled 2014-11-25: qty 2

## 2014-11-25 MED ORDER — HYDROMORPHONE HCL 1 MG/ML IJ SOLN
1.0000 mg | Freq: Once | INTRAMUSCULAR | Status: AC
  Administered 2014-11-25: 1 mg via INTRAVENOUS
  Filled 2014-11-25: qty 1

## 2014-11-25 NOTE — Discharge Instructions (Signed)
You were evaluated for right flank and right upper abdomen pain, and your exam and evaluation are reassuring. We discussed that I think the next step is for you to both follow up with your primary care doctor and complete the physical therapy for likely musculoskeletal cause, however I'm also suggesting following up with a gastroenterologist for the consideration of colonoscopy and/or endoscopy for evaluation of chronic and recurrent abdominal pain.  Return to the emergency department for any worsening condition including worsening pain, fever, vomiting, black or bloody stool, lower abdominal pain, or any other symptoms and this concerning to you.   Abdominal Pain Many things can cause abdominal pain. Usually, abdominal pain is not caused by a disease and will improve without treatment. It can often be observed and treated at home. Your health care provider will do a physical exam and possibly order blood tests and X-rays to help determine the seriousness of your pain. However, in many cases, more time must pass before a clear cause of the pain can be found. Before that point, your health care provider may not know if you need more testing or further treatment. HOME CARE INSTRUCTIONS  Monitor your abdominal pain for any changes. The following actions may help to alleviate any discomfort you are experiencing:  Only take over-the-counter or prescription medicines as directed by your health care provider.  Do not take laxatives unless directed to do so by your health care provider.  Try a clear liquid diet (broth, tea, or water) as directed by your health care provider. Slowly move to a bland diet as tolerated. SEEK MEDICAL CARE IF:  You have unexplained abdominal pain.  You have abdominal pain associated with nausea or diarrhea.  You have pain when you urinate or have a bowel movement.  You experience abdominal pain that wakes you in the night.  You have abdominal pain that is worsened or  improved by eating food.  You have abdominal pain that is worsened with eating fatty foods.  You have a fever. SEEK IMMEDIATE MEDICAL CARE IF:   Your pain does not go away within 2 hours.  You keep throwing up (vomiting).  Your pain is felt only in portions of the abdomen, such as the right side or the left lower portion of the abdomen.  You pass bloody or black tarry stools. MAKE SURE YOU:  Understand these instructions.   Will watch your condition.   Will get help right away if you are not doing well or get worse.  Document Released: 12/27/2004 Document Revised: 03/24/2013 Document Reviewed: 11/26/2012 [facility] Patient Information 2015 [place], [place]. This information is not intended to replace advice given to you by your health care provider. Make sure you discuss any questions you have with your health care provider.

## 2014-11-25 NOTE — ED Notes (Signed)
MD at bedside. 

## 2014-11-25 NOTE — ED Notes (Signed)
Patient ambulatory to triage with steady gait, without difficulty or distress noted; pt reports having right lower back pain going up into his shoulder x year; pt accomp by male who st "it's the same thing he's been here for over and over and he's been to his doctor and they give him pain medication and it goes away then comes back; they tell him it's muscle spasm"

## 2014-11-25 NOTE — ED Notes (Signed)
Pt presents with ongoing pain on the right side low back pain radiating up into shoulder. Has been see for same in the past. Denies any n/v/d. No acute distress noted.

## 2014-11-25 NOTE — ED Provider Notes (Signed)
[facility] Emergency Department Provider Note   ____________________________________________  Time seen: 7:10 AM I have reviewed the triage vital signs and the triage nursing note.  HISTORY  Chief Complaint Back Pain   Historian Patient, and his mom.  HPI Jason Collier is a 24 y.o. male who has no chronic medical history, takes no medications daily, but who is presenting for an episode of right flank and right abdominal pain in the upper quadrant area since this morning, which is similar to several other episodes that he has been worked up at [facility] emergency department and [facility] emergency department for. Pain was acute in onset and feels sharp and sometimes throbbing, and is located in the right-sided abdomen more upper, as well as the right flank on the mid back and up to the shoulder blade area. Some nausea and sweating with subjective fever, however no vomiting, constipation, diarrhea, chest pain, or cough. He does report mild shortness of breath that typically calms when he is feeling this severe pain. He didn't have any specific trauma or overuse, however pain is made worse with movement of the trunk either side to side or lying flat to getting up. He's been told previously that he had a kidney infection, and then he did not have a kidney infection, and the dose just musculoskeletal pain.    History reviewed. No pertinent past medical history.  There are no active problems to display for this patient.   History reviewed. No pertinent past surgical history.  Current Outpatient Rx  Name  Route  Sig  Dispense  Refill  . ibuprofen (ADVIL,MOTRIN) 600 MG tablet   Oral   Take 1,200 mg by mouth every 6 (six) hours as needed.         . cyclobenzaprine (FLEXERIL) 10 MG tablet   Oral   Take 1 tablet (10 mg total) by mouth every 8 (eight) hours as needed for muscle spasms.   20 tablet   0   . HYDROcodone-acetaminophen (NORCO/VICODIN) 5-325 MG per  tablet   Oral   Take 1 tablet by mouth every 6 (six) hours as needed for moderate pain.   10 tablet   0   . ibuprofen (ADVIL,MOTRIN) 200 MG tablet   Oral   Take 200 mg by mouth every 6 (six) hours as needed for moderate pain.         . naproxen (NAPROSYN) 500 MG tablet   Oral   Take 1 tablet (500 mg total) by mouth 2 (two) times daily with a meal.   20 tablet   0   . ondansetron (ZOFRAN) 4 MG tablet   Oral   Take 1 tablet (4 mg total) by mouth daily as needed for nausea or vomiting.   10 tablet   1     Allergies Review of patient's allergies indicates no known allergies.  No family history on file.  Social History Social History  Substance Use Topics  . Smoking status: Current Every Day Smoker -- 1.00 packs/day    Types: Cigarettes  . Smokeless tobacco: Former Neurosurgeon  . Alcohol Use: 0.6 oz/week    1 Cans of beer per week     Comment: every three days    Review of Systems  Constitutional: Positive for subjective fever. Eyes: Negative for visual changes. ENT: Negative for sore throat. Cardiovascular: Negative for chest pain. Respiratory: Per history of present illness Gastrointestinal: Per history of present illness Genitourinary: Negative for dysuria. Musculoskeletal: See history of present  illness, no extremity muscular pain Skin: Negative for rash. Neurological: Negative for headache. 10 point Review of Systems otherwise negative ____________________________________________   PHYSICAL EXAM:  VITAL SIGNS: ED Triage Vitals  Enc Vitals Group     BP 11/25/14 0643 140/80 mmHg     Pulse Rate 11/25/14 0643 89     Resp 11/25/14 0643 18     Temp 11/25/14 0643 97.9 F (36.6 C)     Temp Source 11/25/14 0643 Oral     SpO2 11/25/14 0643 95 %     Weight 11/25/14 0643 235 lb (106.595 kg)     Height 11/25/14 0643 5\' 11"  (1.803 m)     Head Cir --      Peak Flow --      Pain Score 11/25/14 0644 9     Pain Loc --      Pain Edu? --      Excl. in GC? --       Constitutional: Alert and oriented. Well appearing and in no distress. Eyes: Conjunctivae are normal. PERRL. Normal extraocular movements. ENT   Head: Normocephalic and atraumatic.   Nose: No congestion/rhinnorhea.   Mouth/Throat: Mucous membranes are moist.   Neck: No stridor. Cardiovascular/Chest: Normal rate, regular rhythm.  No murmurs, rubs, or gallops. Respiratory: Normal respiratory effort without tachypnea nor retractions. Breath sounds are clear and equal bilaterally. No wheezes/rales/rhonchi. Gastrointestinal: Soft. No distention, no guarding, no rebound. Moderate tenderness in the right abdomen and right upper quadrant. No McBurney's point tenderness. No left-sided tenderness. Genitourinary/rectal:Deferred Musculoskeletal: Palpable and tender muscle spasm right paraspinous in the mid back. No spinal bony point tenderness. Nontender with normal range of motion in all extremities. No joint effusions.  No lower extremity tenderness.  No edema. Neurologic:  Normal speech and language. No gross or focal neurologic deficits are appreciated. Skin:  Skin is warm, dry and intact. No rash noted. Psychiatric: Mood and affect are normal. Speech and behavior are normal. Patient exhibits appropriate insight and judgment.  ____________________________________________   EKG I, Governor Rooks, MD, the attending physician have personally viewed and interpreted all ECGs.  80 bpm. Normal sinus rhythm. Narrow QRS. Normal axis. Normal ST and T-wave. ____________________________________________  LABS (pertinent positives/negatives)  Complete medical panel without any abnormality. CBC within normal limits Urinalysis negative for ketones, leukocytes, red blood cells, white blood cells, or bacteria Lipase low at 18  ____________________________________________  RADIOLOGY All Xrays were viewed by me. Imaging interpreted by Radiologist.  Right upper quadrant ultrasound: Normal  right upper quadrant ultrasound __________________________________________  PROCEDURES  Procedure(s) performed: None  Critical Care performed: None  ____________________________________________   ED COURSE / ASSESSMENT AND PLAN  CONSULTATIONS: None  Pertinent labs & imaging results that were available during my care of the patient were reviewed by me and considered in my medical decision making (see chart for details).  I reviewed patient's several prior evaluations including CT of the abdomen in May which showed no acute source of the discomfort, and reassuring labs/urine on 2 prior visits. Today patient has stable vital signs, he does have tenderness to palpation in the right upper quadrant and right flank where he does have muscle spasm. Although is possible he is having muscular skeletal pain, I am going to evaluated with labs and an ultrasound right upper quadrant. There is no right lower quadrant pain night doubt appendicitis. Although he hasn't had specific change in his bowels, I think if everything comes up negative and the workup today, I would refer him to  gastroenterology to consider endoscopy and/or colonoscopy as a source of his symptoms.  ----------------------------------------- 9:25 AM on 11/25/2014 -----------------------------------------  I reviewed labs and ultrasound are all normal and reassuring. I reexamined the patient is feeling much better. No right lower quadrant pain. I am not suspicious of an intra-abdominal emergency such as vascular, bowel obstruction, appendicitis, etc. I discussed strict return precautions. I am going to provide a very short course of when necessary Norco, 10 tablets. For the back spasm, I am also prescribed Flexeril. He is already taking ibuprofen and will continue this as well. We discussed the possibility of biliary colic without findings on ultrasound. We also discussed the possibility of Crohn's disease, or ulcerative colitis, or  irritable bowel. He is in a work towards making an appointment with a gastroenterologist..  Patient / Family / Caregiver informed of clinical course, medical decision-making process, and agree with plan.   I discussed return precautions, follow-up instructions, and discharged instructions with patient and/or family.  ___________________________________________   FINAL CLINICAL IMPRESSION(S) / ED DIAGNOSES   Final diagnoses:  Right upper quadrant pain  Right flank pain       Governor Rooks, MD 11/25/14 [phone]

## 2014-12-10 ENCOUNTER — Emergency Department
Admission: EM | Admit: 2014-12-10 | Discharge: 2014-12-10 | Disposition: A | Discharge status: Home / Self Care | Payer: 59 | Attending: Emergency Medicine | Admitting: Emergency Medicine

## 2014-12-10 ENCOUNTER — Encounter: Payer: Self-pay | Admitting: Emergency Medicine

## 2014-12-10 DIAGNOSIS — Z79899 Other long term (current) drug therapy: Secondary | ICD-10-CM | POA: Insufficient documentation

## 2014-12-10 DIAGNOSIS — R200 Anesthesia of skin: Secondary | ICD-10-CM | POA: Insufficient documentation

## 2014-12-10 DIAGNOSIS — Z720 Tobacco use: Secondary | ICD-10-CM | POA: Insufficient documentation

## 2014-12-10 DIAGNOSIS — R51 Headache: Secondary | ICD-10-CM | POA: Insufficient documentation

## 2014-12-10 DIAGNOSIS — H539 Unspecified visual disturbance: Secondary | ICD-10-CM

## 2014-12-10 DIAGNOSIS — H53123 Transient visual loss, bilateral: Secondary | ICD-10-CM | POA: Insufficient documentation

## 2014-12-10 DIAGNOSIS — H538 Other visual disturbances: Secondary | ICD-10-CM | POA: Insufficient documentation

## 2014-12-10 LAB — CBC
HEMATOCRIT: 47.4 % (ref 40.0–52.0)
HEMOGLOBIN: 16.1 g/dL (ref 13.0–18.0)
MCH: 28.7 pg (ref 26.0–34.0)
MCHC: 34 g/dL (ref 32.0–36.0)
MCV: 84.6 fL (ref 80.0–100.0)
Platelets: 233 10*3/uL (ref 150–440)
RBC: 5.61 MIL/uL (ref 4.40–5.90)
RDW: 14.4 % (ref 11.5–14.5)
WBC: 8.3 10*3/uL (ref 3.8–10.6)

## 2014-12-10 LAB — BASIC METABOLIC PANEL
ANION GAP: 9 (ref 5–15)
BUN: 9 mg/dL (ref 6–20)
CHLORIDE: 104 mmol/L (ref 101–111)
CO2: 24 mmol/L (ref 22–32)
Calcium: 9.7 mg/dL (ref 8.9–10.3)
Creatinine, Ser: 1.07 mg/dL (ref 0.61–1.24)
GFR calc Af Amer: >60 mL/min (ref >60)
Glucose, Bld: 126 mg/dL — ABNORMAL HIGH (ref 65–99)
POTASSIUM: 3.6 mmol/L (ref 3.5–5.1)
SODIUM: 137 mmol/L (ref 135–145)

## 2014-12-10 MED ORDER — MECLIZINE HCL 25 MG PO TABS: 25.0000 mg | ORAL_TABLET | Freq: Three times a day (TID) | ORAL | Status: DC | PRN

## 2014-12-10 MED ORDER — MECLIZINE HCL 25 MG PO TABS
25.0000 mg | ORAL_TABLET | Freq: Once | ORAL | Status: AC
  Administered 2014-12-10: 25 mg via ORAL
  Filled 2014-12-10: qty 1
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
## 2014-12-10 NOTE — Discharge Instructions (Signed)
Please seek medical attention for any high fevers, chest pain, shortness of breath, change in behavior, persistent vomiting, bloody stool or any other new or concerning symptoms.   Visual Disturbances You have had a disturbance in your vision. This may be caused by various conditions, such as:  Migraines. Migraine headaches are often preceded by a disturbance in vision. Blind spots or light flashes are followed by a headache. This type of visual disturbance is temporary. It does not damage the eye.  Glaucoma. This is caused by increased pressure in the eye. Symptoms include haziness, blurred vision, or seeing rainbow colored circles when looking at bright lights. Partial or complete visual loss can occur. You may or may not experience eye pain. Visual loss may be gradual or sudden and is irreversible. Glaucoma is the leading cause of blindness.  Retina problems. Vision will be reduced if the retina becomes detached or if there is a circulation problem as with diabetes, high blood pressure, or a mini-stroke. Symptoms include seeing "floaters," flashes of light, or shadows, as if a curtain has fallen over your eye.  Optic nerve problems. The main nerve in your eye can be damaged by redness, soreness, and swelling (inflammation), poor circulation, drugs, and toxins. It is very important to have a complete exam done by a specialist to determine the exact cause of your eye problem. The specialist may recommend medicines or surgery, depending on the cause of the problem. This can help prevent further loss of vision or reduce the risk of having a stroke. Contact the caregiver to whom you have been referred and arrange for follow-up care right away. SEEK IMMEDIATE MEDICAL CARE IF:   Your vision gets worse.  You develop severe headaches.  You have any weakness or numbness in the face, arms, or legs.  You have any trouble speaking or walking. Document Released: 04/26/2004 Document Revised: 06/11/2011  Document Reviewed: 08/17/2009 [facility] Patient Information 2015 [place], [place]. This information is not intended to replace advice given to you by your health care provider. Make sure you discuss any questions you have with your health care provider.
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
## 2015-03-27 ENCOUNTER — Emergency Department
Admission: EM | Admit: 2015-03-27 | Discharge: 2015-03-27 | Disposition: A | Discharge status: Home / Self Care | Payer: 59 | Attending: Emergency Medicine | Admitting: Emergency Medicine

## 2015-03-27 ENCOUNTER — Emergency Department: Payer: 59

## 2015-03-27 DIAGNOSIS — F10120 Alcohol abuse with intoxication, uncomplicated: Secondary | ICD-10-CM | POA: Insufficient documentation

## 2015-03-27 DIAGNOSIS — F10920 Alcohol use, unspecified with intoxication, uncomplicated: Secondary | ICD-10-CM

## 2015-03-27 DIAGNOSIS — F17210 Nicotine dependence, cigarettes, uncomplicated: Secondary | ICD-10-CM | POA: Insufficient documentation

## 2015-03-27 DIAGNOSIS — Z791 Long term (current) use of non-steroidal anti-inflammatories (NSAID): Secondary | ICD-10-CM | POA: Insufficient documentation

## 2015-03-27 LAB — CBC WITH DIFFERENTIAL/PLATELET
BASOS ABS: 0 10*3/uL (ref 0–0.1)
BLASTS: 0 %
Band Neutrophils: 0 %
Basophils Relative: 0 %
EOS PCT: 1 %
Eosinophils Absolute: 0.1 10*3/uL (ref 0–0.7)
HEMATOCRIT: 46.4 % (ref 40.0–52.0)
HEMOGLOBIN: 15.7 g/dL (ref 13.0–18.0)
LYMPHS ABS: 5.6 10*3/uL — AB (ref 1.0–3.6)
Lymphocytes Relative: 63 %
MCH: 28.5 pg (ref 26.0–34.0)
MCHC: 33.8 g/dL (ref 32.0–36.0)
MCV: 84.3 fL (ref 80.0–100.0)
METAMYELOCYTES PCT: 0 %
MONOS PCT: 13 %
MYELOCYTES: 0 %
Monocytes Absolute: 1.2 10*3/uL — ABNORMAL HIGH (ref 0.2–1.0)
NEUTROS PCT: 23 %
NRBC: 0 /100{WBCs}
Neutro Abs: 2.1 10*3/uL (ref 1.4–6.5)
Other: 0 %
Platelets: 212 10*3/uL (ref 150–440)
Promyelocytes Absolute: 0 %
RBC: 5.51 MIL/uL (ref 4.40–5.90)
RDW: 14.1 % (ref 11.5–14.5)
WBC: 9 10*3/uL (ref 3.8–10.6)

## 2015-03-27 LAB — COMPREHENSIVE METABOLIC PANEL
ALBUMIN: 4.7 g/dL (ref 3.5–5.0)
ALK PHOS: 63 U/L (ref 38–126)
ALT: 35 U/L (ref 17–63)
AST: 33 U/L (ref 15–41)
Anion gap: 9 (ref 5–15)
BILIRUBIN TOTAL: 0.8 mg/dL (ref 0.3–1.2)
BUN: 8 mg/dL (ref 6–20)
CALCIUM: 9.2 mg/dL (ref 8.9–10.3)
CO2: 24 mmol/L (ref 22–32)
CREATININE: 1.09 mg/dL (ref 0.61–1.24)
Chloride: 107 mmol/L (ref 101–111)
GFR calc Af Amer: >60 mL/min (ref >60)
GLUCOSE: 105 mg/dL — AB (ref 65–99)
POTASSIUM: 3.5 mmol/L (ref 3.5–5.1)
Sodium: 140 mmol/L (ref 135–145)
TOTAL PROTEIN: 7.3 g/dL (ref 6.5–8.1)

## 2015-03-27 LAB — ACETAMINOPHEN LEVEL

## 2015-03-27 LAB — GLUCOSE, CAPILLARY: Glucose-Capillary: 88 mg/dL (ref 65–99)

## 2015-03-27 LAB — ETHANOL: ALCOHOL ETHYL (B): 208 mg/dL — AB (ref <5)

## 2015-03-27 LAB — SALICYLATE LEVEL

## 2015-03-27 MED ORDER — DEXTROSE-NACL 5-0.45 % IV SOLN
INTRAVENOUS | Status: DC
  Administered 2015-03-27: 999 mL via INTRAVENOUS

## 2015-03-27 MED ORDER — SODIUM CHLORIDE 0.9 % IV BOLUS (SEPSIS)
1000.0000 mL | Freq: Once | INTRAVENOUS | Status: AC
  Administered 2015-03-27: 1000 mL via INTRAVENOUS

## 2015-03-27 MED ORDER — ONDANSETRON HCL 4 MG/2ML IJ SOLN
4.0000 mg | Freq: Once | INTRAMUSCULAR | Status: AC
  Administered 2015-03-27: 4 mg via INTRAVENOUS
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
## 2015-03-27 NOTE — ED Notes (Signed)
Pt friends with Brie, ED Tech who is going to give him a ride home.
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
## 2015-03-27 NOTE — ED Notes (Signed)
Pt verbalized understanding of discharge instructions. NAD at this time. 

## 2015-03-27 NOTE — ED Notes (Signed)
[place]

## 2015-03-27 NOTE — Discharge Instructions (Signed)
1. Drink alcohol only in moderation. You were so intoxicated tonight that you were completely passed out. This level of intoxication may impair judgment and lead to coma or even death. 2. Return to the ER for worsening symptoms, persistent vomiting, lethargy or any concerns.  Alcohol Intoxication Alcohol intoxication occurs when the amount of alcohol that a person has consumed impairs his or her ability to mentally and physically function. Alcohol directly impairs the normal chemical activity of the brain. Drinking large amounts of alcohol can lead to changes in mental function and behavior, and it can cause many physical effects that can be harmful.  Alcohol intoxication can range in severity from mild to very severe. Various factors can affect the level of intoxication that occurs, such as the person's age, gender, weight, frequency of alcohol consumption, and the presence of other medical conditions (such as diabetes, seizures, or heart conditions). Dangerous levels of alcohol intoxication may occur when people drink large amounts of alcohol in a short period (binge drinking). Alcohol can also be especially dangerous when combined with certain prescription medicines or "recreational" drugs. SIGNS AND SYMPTOMS Some common signs and symptoms of mild alcohol intoxication include:  Loss of coordination.  Changes in mood and behavior.  Impaired judgment.  Slurred speech. As alcohol intoxication progresses to more severe levels, other signs and symptoms will appear. These may include:  Vomiting.  Confusion and impaired memory.  Slowed breathing.  Seizures.  Loss of consciousness. DIAGNOSIS  Your health care provider will take a medical history and perform a physical exam. You will be asked about the amount and type of alcohol you have consumed. Blood tests will be done to measure the concentration of alcohol in your blood. In many places, your blood alcohol level must be lower than 80 mg/dL  (1.30%) to legally drive. However, many dangerous effects of alcohol can occur at much lower levels.  TREATMENT  People with alcohol intoxication often do not require treatment. Most of the effects of alcohol intoxication are temporary, and they go away as the alcohol naturally leaves the body. Your health care provider will monitor your condition until you are stable enough to go home. Fluids are sometimes given through an IV access tube to help prevent dehydration.  HOME CARE INSTRUCTIONS  Do not drive after drinking alcohol.  Stay hydrated. Drink enough water and fluids to keep your urine clear or pale yellow. Avoid caffeine.   Only take over-the-counter or prescription medicines as directed by your health care provider.  SEEK MEDICAL CARE IF:   You have persistent vomiting.   You do not feel better after a few days.  You have frequent alcohol intoxication. Your health care provider can help determine if you should see a substance use treatment counselor. SEEK IMMEDIATE MEDICAL CARE IF:   You become shaky or tremble when you try to stop drinking.   You shake uncontrollably (seizure).   You throw up (vomit) blood. This may be bright red or may look like black coffee grounds.   You have blood in your stool. This may be bright red or may appear as a black, tarry, bad smelling stool.   You become lightheaded or faint.  MAKE SURE YOU:   Understand these instructions.  Will watch your condition.  Will get help right away if you are not doing well or get worse.   This information is not intended to replace advice given to you by your health care provider. Make sure you discuss any questions you  have with your health care provider.   Document Released: 12/27/2004 Document Revised: 11/19/2012 Document Reviewed: 08/22/2012 [employer] Interactive Patient Education [employer]

## 2015-05-23 ENCOUNTER — Other Ambulatory Visit: Payer: Self-pay | Admitting: Family Medicine

## 2015-05-23 ENCOUNTER — Ambulatory Visit
Admission: RE | Admit: 2015-05-23 | Discharge: 2015-05-23 | Disposition: A | Discharge status: Home / Self Care | Payer: Worker's Compensation | Source: Ambulatory Visit | Attending: Family Medicine | Admitting: Family Medicine

## 2015-05-23 DIAGNOSIS — M79661 Pain in right lower leg: Secondary | ICD-10-CM

## 2015-07-08 ENCOUNTER — Emergency Department: Payer: Self-pay

## 2015-07-08 ENCOUNTER — Emergency Department
Admission: EM | Admit: 2015-07-08 | Discharge: 2015-07-08 | Disposition: A | Discharge status: Home / Self Care | Payer: Self-pay | Attending: Emergency Medicine | Admitting: Emergency Medicine

## 2015-07-08 ENCOUNTER — Encounter: Payer: Self-pay | Admitting: Emergency Medicine

## 2015-07-08 DIAGNOSIS — R072 Precordial pain: Secondary | ICD-10-CM | POA: Insufficient documentation

## 2015-07-08 DIAGNOSIS — W2201XA Walked into wall, initial encounter: Secondary | ICD-10-CM | POA: Insufficient documentation

## 2015-07-08 DIAGNOSIS — R079 Chest pain, unspecified: Secondary | ICD-10-CM

## 2015-07-08 DIAGNOSIS — Y999 Unspecified external cause status: Secondary | ICD-10-CM | POA: Insufficient documentation

## 2015-07-08 DIAGNOSIS — S60221A Contusion of right hand, initial encounter: Secondary | ICD-10-CM | POA: Insufficient documentation

## 2015-07-08 DIAGNOSIS — F410 Panic disorder [episodic paroxysmal anxiety] without agoraphobia: Secondary | ICD-10-CM

## 2015-07-08 DIAGNOSIS — Y939 Activity, unspecified: Secondary | ICD-10-CM | POA: Insufficient documentation

## 2015-07-08 DIAGNOSIS — Y9269 Other specified industrial and construction area as the place of occurrence of the external cause: Secondary | ICD-10-CM | POA: Insufficient documentation

## 2015-07-08 DIAGNOSIS — F419 Anxiety disorder, unspecified: Secondary | ICD-10-CM | POA: Insufficient documentation

## 2015-07-08 DIAGNOSIS — F17210 Nicotine dependence, cigarettes, uncomplicated: Secondary | ICD-10-CM | POA: Insufficient documentation

## 2015-07-08 LAB — BASIC METABOLIC PANEL
Anion gap: 7 (ref 5–15)
BUN: 13 mg/dL (ref 6–20)
CALCIUM: 10.2 mg/dL (ref 8.9–10.3)
CHLORIDE: 105 mmol/L (ref 101–111)
CO2: 25 mmol/L (ref 22–32)
CREATININE: 1.12 mg/dL (ref 0.61–1.24)
GFR calc non Af Amer: >60 mL/min (ref >60)
GLUCOSE: 99 mg/dL (ref 65–99)
Potassium: 4.9 mmol/L (ref 3.5–5.1)
Sodium: 137 mmol/L (ref 135–145)

## 2015-07-08 LAB — CBC
HCT: 49.3 % (ref 40.0–52.0)
Hemoglobin: 16.8 g/dL (ref 13.0–18.0)
MCH: 27.8 pg (ref 26.0–34.0)
MCHC: 34 g/dL (ref 32.0–36.0)
MCV: 81.8 fL (ref 80.0–100.0)
PLATELETS: 228 10*3/uL (ref 150–440)
RBC: 6.03 MIL/uL — AB (ref 4.40–5.90)
RDW: 14.9 % — ABNORMAL HIGH (ref 11.5–14.5)
WBC: 6.7 10*3/uL (ref 3.8–10.6)

## 2015-07-08 LAB — TROPONIN I

## 2015-07-08 LAB — FIBRIN DERIVATIVES D-DIMER (ARMC ONLY): Fibrin derivatives D-dimer (ARMC): 289 (ref 0–499)

## 2015-07-08 NOTE — ED Notes (Signed)
Pt c/o recurrent CP and SOB. Pt able to ambulate w/o difficulty, able to speak w/o difficulty.

## 2015-07-08 NOTE — ED Provider Notes (Addendum)
[facility] Emergency Department Provider Note  ____________________________________________  Time seen: Approximately 225 PM  I have reviewed the triage vital signs and the nursing notes.   HISTORY  Chief Complaint Chest Pain and Shortness of Breath   HPI Jason Collier is a 25 y.o. male with a history of a recent right meniscus tear who is presenting to the emergency department with sudden onset chest pain. He says he was at work and became very anxious and stressed. He said that he was punching a wall with his right hand. About 2 hours after which she began to feel chest pain across the front of his chest. He says that its maximum was a 10 out of 10. He says that since calming down his chest pain is now a 6 out of 10. He says he also had a headache with the pain which is also decreasing at this time. Headache was not sudden onset or thunderclap or the worst headache of his life. Patient says that he has had issues with anxiety in the past. He says that he has had recent stress with his right meniscus tear.  Denies any swelling to the lower extremities. Denies any history of blood clots. Denies any sudden death in his family at a young age. Says that he does smoke. We'll drinking. Has not had surgery on his meniscus yet. Is taking methylprednisolone.  Denies any hormone supplementation. York Spaniel that he has some associated shortness of breath with this chest pain but no nausea vomiting or diaphoresis. No radiation to his back, arms, jaw or neck.  Also complaining of right MCP tenderness where he was punching the wall. He denies any homicidal or suicidal ideation. However, due to his anxiety peaking he has scheduled a psychiatry appointment this Monday at [facility]. He also says that he will be trying to see a primary care doctor next week but does not have an appointment with a primary care doctor.   History reviewed. No pertinent past medical history.  There are no active  problems to display for this patient.   History reviewed. No pertinent past surgical history.  Current Outpatient Rx  Name  Route  Sig  Dispense  Refill  . cyclobenzaprine (FLEXERIL) 10 MG tablet   Oral   Take 1 tablet (10 mg total) by mouth every 8 (eight) hours as needed for muscle spasms. Patient not taking: Reported on 03/27/2015   20 tablet   0   . HYDROcodone-acetaminophen (NORCO/VICODIN) 5-325 MG per tablet   Oral   Take 1 tablet by mouth every 6 (six) hours as needed for moderate pain. Patient not taking: Reported on 03/27/2015   10 tablet   0   . ibuprofen (ADVIL,MOTRIN) 200 MG tablet   Oral   Take 200 mg by mouth every 6 (six) hours as needed for moderate pain.         Marland Kitchen ibuprofen (ADVIL,MOTRIN) 600 MG tablet   Oral   Take 1,200 mg by mouth every 6 (six) hours as needed.         . meclizine (ANTIVERT) 25 MG tablet   Oral   Take 1 tablet (25 mg total) by mouth 3 (three) times daily as needed for dizziness. Patient not taking: Reported on 03/27/2015   15 tablet   0   . naproxen (NAPROSYN) 500 MG tablet   Oral   Take 1 tablet (500 mg total) by mouth 2 (two) times daily with a meal. Patient not taking: Reported on  03/27/2015   20 tablet   0   . ondansetron (ZOFRAN) 4 MG tablet   Oral   Take 1 tablet (4 mg total) by mouth daily as needed for nausea or vomiting. Patient not taking: Reported on 03/27/2015   10 tablet   1     Allergies Tramadol  No family history on file.  Social History Social History  Substance Use Topics  . Smoking status: Current Every Day Smoker -- 0.50 packs/day    Types: Cigarettes  . Smokeless tobacco: Former NeurosurgeonUser  . Alcohol Use: 0.6 oz/week    1 Cans of beer per week     Comment: every three days    Review of Systems Constitutional: No fever/chills Eyes: No visual changes. ENT: No sore throat. Cardiovascular:  As above Respiratory: As above Gastrointestinal: No abdominal pain.  No nausea, no vomiting.  No  diarrhea.  No constipation. Genitourinary: Negative for dysuria. Musculoskeletal: Negative for back pain. Skin: Negative for rash. Neurological: Negative for focal weakness or numbness.  10-point ROS otherwise negative.  ____________________________________________   PHYSICAL EXAM:  VITAL SIGNS: ED Triage Vitals  Enc Vitals Group     BP 07/08/15 1416 112/82 mmHg     Pulse Rate 07/08/15 1416 56     Resp 07/08/15 1416 16     Temp --      Temp src --      SpO2 07/08/15 1416 100 %     Weight 07/08/15 1139 240 lb (108.863 kg)     Height 07/08/15 1139 5\' 11"  (1.803 m)     Head Cir --      Peak Flow --      Pain Score --      Pain Loc --      Pain Edu? --      Excl. in GC? --     Constitutional: Alert and oriented. Well appearing and in no acute distress. Eyes: Conjunctivae are normal. PERRL. EOMI. Head: Atraumatic. Nose: No congestion/rhinnorhea. Mouth/Throat: Mucous membranes are moist.  Neck: No stridor.   Cardiovascular: Normal rate, regular rhythm. Grossly normal heart sounds.  Good peripheral circulation with equal and bilateral radial as well as dorsalis pedis pulses. Chest pain is reproducible especially just left of the sternum. Respiratory: Normal respiratory effort.  No retractions. Lungs CTAB. Gastrointestinal: Soft and nontender. No distention. No abdominal bruits. No CVA tenderness. Musculoskeletal: No lower extremity tenderness nor edema.  No joint effusions. No tenderness to the popliteal fossa bilaterally. No ropelike structures behind the knees bilaterally. Right middle finger MCP J without any deformity. Mild tenderness dorsally. Able to fully range the fingers. Sensation is intact to light touch. Full strength is present. Neurologic:  Normal speech and language. No gross focal neurologic deficits are appreciated. No gait instability. Skin:  Skin is warm, dry and intact. No rash noted. Psychiatric: Mood and affect are normal. Speech and behavior are  normal.  ____________________________________________   LABS (all labs ordered are listed, but only abnormal results are displayed)  Labs Reviewed  CBC - Abnormal; Notable for the following:    RBC 6.03 (*)    RDW 14.9 (*)    All other components within normal limits  BASIC METABOLIC PANEL  TROPONIN I   ____________________________________________  EKG  ED ECG REPORT I, Arelia LongestSchaevitz,  David M, the attending physician, personally viewed and interpreted this ECG.   Date: 07/08/2015  EKG Time: 1139  Rate: 78  Rhythm: normal sinus rhythm  Axis: Normal  Intervals:none  ST&T  Change: No ST elevation or depression. No abnormal T-wave inversion.  ____________________________________________  RADIOLOGY  DG Chest 2 View (Final result) Result time: 07/08/15 13:01:30   Final result by Rad Results In Interface (07/08/15 13:01:30)   Narrative:   CLINICAL DATA: 25 year old male with acute chest pain for 1 hour. Initial encounter.  EXAM: CHEST 2 VIEW  COMPARISON: 11/15/2010.  FINDINGS: Lower lung volumes. Mediastinal contours remain normal. Visualized tracheal air column is within normal limits. Mild crowding of lung markings. Otherwise the lungs are clear. No pneumothorax or effusion. Negative visible bowel gas pattern. No acute osseous abnormality identified.  IMPRESSION: Low lung volumes, otherwise no acute cardiopulmonary abnormality.   Electronically Signed By: Odessa Fleming M.D. On: 07/08/2015 13:01       ____________________________________________   PROCEDURES   ____________________________________________   INITIAL IMPRESSION / ASSESSMENT AND PLAN / ED COURSE  Pertinent labs & imaging results that were available during my care of the patient were reviewed by me and considered in my medical decision making (see chart for details).  Patient with semi-recent trauma in the tear in his right meniscus about 2 months ago. We'll send a  d-dimer.  ----------------------------------------- 2:54 PM on 07/08/2015 -----------------------------------------  Patient with presentation that sounds consistent with anxiety attack but with fairly recent trauma. His lungs d-dimer is within normal limits the patient may be discharged home. Signed out to Dr. Lenard Lance. Explained the plan with the patient understands and is willing to comply. He also has refused to write hand x-ray. He does have concern that he may have a fracture in his right MCP J.  However, he is able to range the hand fully and only mild tenderness to palpation. I offered to x-ray his hand but he says he does not want to delay his discharge any further and just once the blood tests. He understands that it would take very little time to the chest x-ray and we could do this while he is waiting for the results of his blood test but he is still denying. ____________________________________________   FINAL CLINICAL IMPRESSION(S) / ED DIAGNOSES  Chest pain. Anxiety attack. Right hand contusion.    Myrna Blazer, MD 07/08/15 1457  Oral temperature taken at bedside and is 98.1  Myrna Blazer, MD 07/08/15 1501

## 2015-07-08 NOTE — ED Notes (Signed)
Pt here with c/o "pushing" central cp that began "all of the sudden" while he was sitting down today. Pt states this has happened before, but it hasn't stayed constant like this in the past. Denies cough, pt very anxious, also c/o headache that began after the cp started.

## 2015-07-08 NOTE — ED Notes (Signed)
MD at bedside. 

## 2015-07-15 ENCOUNTER — Encounter
Admission: RE | Admit: 2015-07-15 | Discharge: 2015-07-15 | Disposition: A | Discharge status: Home / Self Care | Payer: Worker's Compensation | Source: Ambulatory Visit | Attending: Orthopedic Surgery | Admitting: Orthopedic Surgery

## 2015-07-15 DIAGNOSIS — Z01812 Encounter for preprocedural laboratory examination: Secondary | ICD-10-CM | POA: Insufficient documentation

## 2015-07-15 HISTORY — DX: Renal tubulo-interstitial disease, unspecified: N15.9

## 2015-07-15 HISTORY — DX: Anxiety disorder, unspecified: F41.9

## 2015-07-15 HISTORY — DX: Other allergic rhinitis: J30.89

## 2015-07-15 NOTE — Patient Instructions (Signed)
  Your procedure is scheduled YQ:MVHQIONon:Tuesday July 19, 2015. Report to Same Day Surgery. To find out your arrival time please call [phone] between 1PM - 3PM on Monday July 18, 2015.  Remember: Instructions that are not followed completely may result in serious medical risk, up to and including death, or upon the discretion of your surgeon and anesthesiologist your surgery may need to be rescheduled.    _x___ 1. Do not eat food or drink liquids after midnight. No gum chewing or hard candies.     _x___ 2. No Alcohol for 24 hours before or after surgery.   ____ 3. Bring all medications with you on the day of surgery if instructed.    __x__ 4. Notify your doctor if there is any change in your medical condition     (cold, fever, infections).     Do not wear jewelry, make-up, hairpins, clips or nail polish.  Do not wear lotions, powders, or perfumes. You may wear deodorant.  Do not shave 48 hours prior to surgery. Men may shave face and neck.  Do not bring valuables to the hospital.    [facility] any belongings or valuables.               Contacts, dentures or bridgework may not be worn into surgery.  Leave your suitcase in the car. After surgery it may be brought to your room.  For patients admitted to the hospital, discharge time is determined by your treatment team.   Patients discharged the day of surgery will not be allowed to drive home.    Please read over the following fact sheets that you were given:   [facility]  __x__ Take these medicines the morning of surgery with A SIP OF WATER:    1.CLONIDINE HCL PO optional  ____ Fleet Enema (as directed)   _x___ Use CHG Soap as directed on instruction sheet  _x___ Use inhalers on the day of surgery and bring to hospital day of surgery  ____ Stop metformin 2 days prior to surgery    ____ Take 1/2 of usual insulin dose the night before surgery and none on the morning of   surgery.    ____ Stop Coumadin/Plavix/aspirin on does not apply.  __x__ Stop Anti-inflammatories such as Advil, Aleve, Ibuprofen, Motrin, Naproxen, Naprosyn, Goodies powders or aspirin products. OK to take Tylenol.   ____ Stop supplements until after surgery.    ____ Bring C-Pap to the hospital.

## 2015-07-19 ENCOUNTER — Encounter
Admission: RE | Disposition: A | Discharge status: Home / Self Care | Payer: Self-pay | Source: Ambulatory Visit | Attending: Orthopedic Surgery

## 2015-07-19 ENCOUNTER — Ambulatory Visit
Admission: RE | Admit: 2015-07-19 | Discharge: 2015-07-19 | Disposition: A | Discharge status: Home / Self Care | Payer: Worker's Compensation | Source: Ambulatory Visit | Attending: Orthopedic Surgery | Admitting: Orthopedic Surgery

## 2015-07-19 ENCOUNTER — Ambulatory Visit: Payer: Worker's Compensation | Admitting: Anesthesiology

## 2015-07-19 ENCOUNTER — Encounter: Payer: Self-pay | Admitting: *Deleted

## 2015-07-19 DIAGNOSIS — M794 Hypertrophy of (infrapatellar) fat pad: Secondary | ICD-10-CM | POA: Insufficient documentation

## 2015-07-19 DIAGNOSIS — S83282A Other tear of lateral meniscus, current injury, left knee, initial encounter: Secondary | ICD-10-CM | POA: Diagnosis present

## 2015-07-19 DIAGNOSIS — X58XXXA Exposure to other specified factors, initial encounter: Secondary | ICD-10-CM | POA: Diagnosis not present

## 2015-07-19 DIAGNOSIS — Z5309 Procedure and treatment not carried out because of other contraindication: Secondary | ICD-10-CM | POA: Diagnosis not present

## 2015-07-19 LAB — URINE DRUG SCREEN, QUALITATIVE (ARMC ONLY)
Amphetamines, Ur Screen: NOT DETECTED
BARBITURATES, UR SCREEN: NOT DETECTED
Benzodiazepine, Ur Scrn: NOT DETECTED
COCAINE METABOLITE, UR ~~LOC~~: POSITIVE — AB
Cannabinoid 50 Ng, Ur ~~LOC~~: NOT DETECTED
MDMA (Ecstasy)Ur Screen: NOT DETECTED
METHADONE SCREEN, URINE: NOT DETECTED
OPIATE, UR SCREEN: NOT DETECTED
PHENCYCLIDINE (PCP) UR S: NOT DETECTED
Tricyclic, Ur Screen: NOT DETECTED

## 2015-07-19 SURGERY — ARTHROSCOPY, KNEE, WITH MENISCUS REPAIR
Anesthesia: General | Laterality: Right

## 2015-07-19 MED ORDER — BUPIVACAINE-EPINEPHRINE (PF) 0.5% -1:200000 IJ SOLN
INTRAMUSCULAR | Status: AC
  Filled 2015-07-19: qty 30

## 2015-07-19 MED ORDER — FAMOTIDINE 20 MG PO TABS
ORAL_TABLET | ORAL | Status: AC
  Administered 2015-07-19: 20 mg via ORAL
  Filled 2015-07-19: qty 1

## 2015-07-19 MED ORDER — FAMOTIDINE 20 MG PO TABS
20.0000 mg | ORAL_TABLET | Freq: Once | ORAL | Status: AC
  Administered 2015-07-19: 20 mg via ORAL

## 2015-07-19 MED ORDER — LACTATED RINGERS IV SOLN
INTRAVENOUS | Status: DC
  Administered 2015-07-19: 10:00:00 via INTRAVENOUS

## 2015-07-19 SURGICAL SUPPLY — 25 items
BANDAGE ACE 4X5 VEL STRL LF (GAUZE/BANDAGES/DRESSINGS) IMPLANT
BANDAGE ELASTIC 4 LF NS (GAUZE/BANDAGES/DRESSINGS) IMPLANT
BLADE FULL RADIUS 3.5 (BLADE) IMPLANT
BLADE INCISOR PLUS 4.5 (BLADE) IMPLANT
BLADE SHAVER 4.5 DBL SERAT CV (CUTTER) IMPLANT
BLADE SHAVER 4.5X7 STR FR (MISCELLANEOUS) IMPLANT
CHLORAPREP W/TINT 26ML (MISCELLANEOUS) IMPLANT
CUTTER AGGRESSIVE+ 3.5 (CUTTER) IMPLANT
GAUZE SPONGE 4X4 12PLY STRL (GAUZE/BANDAGES/DRESSINGS) IMPLANT
GLOVE SURG ORTHO 9.0 STRL STRW (GLOVE) IMPLANT
GOWN STRL REUS W/ TWL LRG LVL3 (GOWN DISPOSABLE) IMPLANT
GOWN STRL REUS W/TWL LRG LVL3 (GOWN DISPOSABLE)
GOWN SURG XXL (GOWNS) IMPLANT
IV LACTATED RINGER IRRG 3000ML (IV SOLUTION)
IV LR IRRIG 3000ML ARTHROMATIC (IV SOLUTION) IMPLANT
KIT RM TURNOVER STRD PROC AR (KITS) IMPLANT
MANIFOLD NEPTUNE II (INSTRUMENTS) IMPLANT
PACK ARTHROSCOPY KNEE (MISCELLANEOUS) IMPLANT
SET TUBE SUCT SHAVER OUTFL 24K (TUBING) IMPLANT
SET TUBE TIP INTRA-ARTICULAR (MISCELLANEOUS) IMPLANT
SUT ETHILON 4-0 (SUTURE)
SUT ETHILON 4-0 FS2 18XMFL BLK (SUTURE)
SUTURE ETHLN 4-0 FS2 18XMF BLK (SUTURE) IMPLANT
TUBING ARTHRO INFLOW-ONLY STRL (TUBING) IMPLANT
WAND HAND CNTRL MULTIVAC 50 (MISCELLANEOUS) IMPLANT

## 2015-07-19 NOTE — OR Nursing (Signed)
UDS positive for cocaine- Dr Rosita KeaMenz and Dr Noralyn Pickarroll aware - case cancelled will be resceduled by office- pt left unit ambulatory

## 2015-07-19 NOTE — H&P (Signed)
Reviewed paper H+P, will be scanned into chart. No changes noted.  

## 2015-07-19 NOTE — Anesthesia Preprocedure Evaluation (Addendum)
Anesthesia Evaluation  Patient identified by MRN, date of birth, ID band Patient awake    Reviewed: Allergy & Precautions, NPO status , Patient's Chart, lab work & pertinent test results  Airway Mallampati: II  TM Distance: >3 FB Neck ROM: Full    Dental no notable dental hx.    Pulmonary Current Smoker,    Pulmonary exam normal breath sounds clear to auscultation       Cardiovascular negative cardio ROS Normal cardiovascular exam     Neuro/Psych Anxiety    GI/Hepatic (+)     substance abuse  cocaine use,   Endo/Other  negative endocrine ROS  Renal/GU Hx of kidney infection in the past  negative genitourinary   Musculoskeletal Right meniscus tear   Abdominal Normal abdominal exam  (+)   Peds negative pediatric ROS (+)  Hematology negative hematology ROS (+)   Anesthesia Other Findings Right meniscus tear  Reproductive/Obstetrics                          Anesthesia Physical Anesthesia Plan  ASA: II  Anesthesia Plan: General   Post-op Pain Management:    Induction: Intravenous  Airway Management Planned: LMA  Additional Equipment:   Intra-op Plan:   Post-operative Plan: Extubation in OR  Informed Consent: I have reviewed the patients History and Physical, chart, labs and discussed the procedure including the risks, benefits and alternatives for the proposed anesthesia with the patient or authorized representative who has indicated his/her understanding and acceptance.   Dental advisory given  Plan Discussed with: CRNA and Surgeon  Anesthesia Plan Comments:         Anesthesia Quick Evaluation

## 2015-07-26 ENCOUNTER — Ambulatory Visit
Admission: RE | Admit: 2015-07-26 | Discharge: 2015-07-26 | Disposition: A | Discharge status: Home / Self Care | Payer: Worker's Compensation | Source: Ambulatory Visit | Attending: Orthopedic Surgery | Admitting: Orthopedic Surgery

## 2015-07-26 ENCOUNTER — Ambulatory Visit: Payer: Worker's Compensation | Admitting: Anesthesiology

## 2015-07-26 ENCOUNTER — Encounter
Admission: RE | Disposition: A | Discharge status: Home / Self Care | Payer: Self-pay | Source: Ambulatory Visit | Attending: Orthopedic Surgery

## 2015-07-26 ENCOUNTER — Encounter: Payer: Self-pay | Admitting: *Deleted

## 2015-07-26 DIAGNOSIS — F419 Anxiety disorder, unspecified: Secondary | ICD-10-CM | POA: Diagnosis not present

## 2015-07-26 DIAGNOSIS — Y929 Unspecified place or not applicable: Secondary | ICD-10-CM | POA: Diagnosis not present

## 2015-07-26 DIAGNOSIS — Y990 Civilian activity done for income or pay: Secondary | ICD-10-CM | POA: Insufficient documentation

## 2015-07-26 DIAGNOSIS — Z79899 Other long term (current) drug therapy: Secondary | ICD-10-CM | POA: Diagnosis not present

## 2015-07-26 DIAGNOSIS — K219 Gastro-esophageal reflux disease without esophagitis: Secondary | ICD-10-CM | POA: Diagnosis not present

## 2015-07-26 DIAGNOSIS — S83281A Other tear of lateral meniscus, current injury, right knee, initial encounter: Secondary | ICD-10-CM | POA: Insufficient documentation

## 2015-07-26 DIAGNOSIS — X58XXXA Exposure to other specified factors, initial encounter: Secondary | ICD-10-CM | POA: Insufficient documentation

## 2015-07-26 DIAGNOSIS — Y939 Activity, unspecified: Secondary | ICD-10-CM | POA: Insufficient documentation

## 2015-07-26 DIAGNOSIS — F17210 Nicotine dependence, cigarettes, uncomplicated: Secondary | ICD-10-CM | POA: Diagnosis not present

## 2015-07-26 HISTORY — PX: KNEE ARTHROSCOPY WITH LATERAL MENISECTOMY: SHX6193

## 2015-07-26 LAB — URINE DRUG SCREEN, QUALITATIVE (ARMC ONLY)
AMPHETAMINES, UR SCREEN: NOT DETECTED
Barbiturates, Ur Screen: NOT DETECTED
Benzodiazepine, Ur Scrn: NOT DETECTED
CANNABINOID 50 NG, UR ~~LOC~~: NOT DETECTED
COCAINE METABOLITE, UR ~~LOC~~: NOT DETECTED
MDMA (ECSTASY) UR SCREEN: NOT DETECTED
Methadone Scn, Ur: NOT DETECTED
Opiate, Ur Screen: NOT DETECTED
Phencyclidine (PCP) Ur S: NOT DETECTED
TRICYCLIC, UR SCREEN: NOT DETECTED

## 2015-07-26 SURGERY — ARTHROSCOPY, KNEE, WITH LATERAL MENISCECTOMY
Anesthesia: General | Site: Knee | Laterality: Right | Wound class: Clean

## 2015-07-26 MED ORDER — FENTANYL CITRATE (PF) 100 MCG/2ML IJ SOLN
25.0000 ug | INTRAMUSCULAR | Status: DC | PRN
  Administered 2015-07-26 (×2): 50 ug via INTRAVENOUS

## 2015-07-26 MED ORDER — MIDAZOLAM HCL 5 MG/5ML IJ SOLN
INTRAMUSCULAR | Status: DC | PRN
Start: 2015-07-26 — End: 2015-07-26
  Administered 2015-07-26: 2 mg via INTRAVENOUS

## 2015-07-26 MED ORDER — KETAMINE HCL 50 MG/ML IJ SOLN
INTRAMUSCULAR | Status: DC | PRN
Start: 2015-07-26 — End: 2015-07-26
  Administered 2015-07-26: 50 mg via INTRAVENOUS

## 2015-07-26 MED ORDER — GLYCOPYRROLATE 0.2 MG/ML IJ SOLN
INTRAMUSCULAR | Status: DC | PRN
  Administered 2015-07-26: 0.2 mg via INTRAVENOUS

## 2015-07-26 MED ORDER — HYDROCODONE-ACETAMINOPHEN 5-325 MG PO TABS
ORAL_TABLET | ORAL | Status: AC
  Filled 2015-07-26: qty 1

## 2015-07-26 MED ORDER — FENTANYL CITRATE (PF) 100 MCG/2ML IJ SOLN
INTRAMUSCULAR | Status: AC
  Filled 2015-07-26: qty 2

## 2015-07-26 MED ORDER — LIDOCAINE HCL (PF) 2 % IJ SOLN
INTRAMUSCULAR | Status: DC | PRN
  Administered 2015-07-26: 50 mg

## 2015-07-26 MED ORDER — OXYCODONE HCL 5 MG/5ML PO SOLN: 5.0000 mg | Freq: Once | ORAL | Status: DC | PRN

## 2015-07-26 MED ORDER — LACTATED RINGERS IV SOLN
INTRAVENOUS | Status: DC
  Administered 2015-07-26: 12:00:00 via INTRAVENOUS

## 2015-07-26 MED ORDER — OXYCODONE HCL 5 MG PO TABS: 5.0000 mg | ORAL_TABLET | Freq: Once | ORAL | Status: DC | PRN

## 2015-07-26 MED ORDER — EPHEDRINE SULFATE 50 MG/ML IJ SOLN
INTRAMUSCULAR | Status: DC | PRN
  Administered 2015-07-26 (×2): 10 mg via INTRAVENOUS

## 2015-07-26 MED ORDER — KETOROLAC TROMETHAMINE 30 MG/ML IJ SOLN
INTRAMUSCULAR | Status: DC | PRN
  Administered 2015-07-26: 30 mg via INTRAVENOUS

## 2015-07-26 MED ORDER — BUPIVACAINE-EPINEPHRINE (PF) 0.5% -1:200000 IJ SOLN
INTRAMUSCULAR | Status: DC | PRN
  Administered 2015-07-26: 30 mL

## 2015-07-26 MED ORDER — FENTANYL CITRATE (PF) 100 MCG/2ML IJ SOLN
INTRAMUSCULAR | Status: DC | PRN
  Administered 2015-07-26 (×5): 50 ug via INTRAVENOUS

## 2015-07-26 MED ORDER — HYDROCODONE-ACETAMINOPHEN 5-325 MG PO TABS
1.0000 | ORAL_TABLET | Freq: Four times a day (QID) | ORAL | Status: DC | PRN
  Administered 2015-07-26: 1 via ORAL

## 2015-07-26 MED ORDER — ONDANSETRON HCL 4 MG/2ML IJ SOLN
INTRAMUSCULAR | Status: DC | PRN
  Administered 2015-07-26: 4 mg via INTRAVENOUS

## 2015-07-26 MED ORDER — HYDROCODONE-ACETAMINOPHEN 5-325 MG PO TABS: 1.0000 | ORAL_TABLET | Freq: Four times a day (QID) | ORAL | Status: DC | PRN

## 2015-07-26 MED ORDER — PROPOFOL 10 MG/ML IV BOLUS
INTRAVENOUS | Status: DC | PRN
  Administered 2015-07-26: 200 mg via INTRAVENOUS

## 2015-07-26 MED ORDER — FAMOTIDINE 20 MG PO TABS
20.0000 mg | ORAL_TABLET | Freq: Once | ORAL | Status: AC
  Administered 2015-07-26: 20 mg via ORAL

## 2015-07-26 MED ORDER — FAMOTIDINE 20 MG PO TABS
ORAL_TABLET | ORAL | Status: AC
  Administered 2015-07-26: 20 mg via ORAL
  Filled 2015-07-26: qty 1

## 2015-07-26 MED ORDER — BUPIVACAINE-EPINEPHRINE (PF) 0.5% -1:200000 IJ SOLN
INTRAMUSCULAR | Status: AC
  Filled 2015-07-26: qty 30

## 2015-07-26 SURGICAL SUPPLY — 25 items
BANDAGE ACE 4X5 VEL STRL LF (GAUZE/BANDAGES/DRESSINGS) ×2 IMPLANT
BANDAGE ELASTIC 4 LF NS (GAUZE/BANDAGES/DRESSINGS) ×2 IMPLANT
BLADE FULL RADIUS 3.5 (BLADE) IMPLANT
BLADE INCISOR PLUS 4.5 (BLADE) IMPLANT
BLADE SHAVER 4.5 DBL SERAT CV (CUTTER) IMPLANT
BLADE SHAVER 4.5X7 STR FR (MISCELLANEOUS) IMPLANT
CHLORAPREP W/TINT 26ML (MISCELLANEOUS) ×2 IMPLANT
CUTTER AGGRESSIVE+ 3.5 (CUTTER) ×2 IMPLANT
GAUZE SPONGE 4X4 12PLY STRL (GAUZE/BANDAGES/DRESSINGS) ×2 IMPLANT
GLOVE SURG ORTHO 9.0 STRL STRW (GLOVE) ×2 IMPLANT
GOWN STRL REUS W/ TWL LRG LVL3 (GOWN DISPOSABLE) ×1 IMPLANT
GOWN STRL REUS W/TWL LRG LVL3 (GOWN DISPOSABLE) ×1
GOWN SURG XXL (GOWNS) ×2 IMPLANT
IV LACTATED RINGER IRRG 3000ML (IV SOLUTION) ×4
IV LR IRRIG 3000ML ARTHROMATIC (IV SOLUTION) ×4 IMPLANT
KIT RM TURNOVER STRD PROC AR (KITS) ×2 IMPLANT
MANIFOLD NEPTUNE II (INSTRUMENTS) ×2 IMPLANT
PACK ARTHROSCOPY KNEE (MISCELLANEOUS) ×2 IMPLANT
SET TUBE SUCT SHAVER OUTFL 24K (TUBING) ×2 IMPLANT
SET TUBE TIP INTRA-ARTICULAR (MISCELLANEOUS) ×2 IMPLANT
SUT ETHILON 4-0 (SUTURE) ×1
SUT ETHILON 4-0 FS2 18XMFL BLK (SUTURE) ×1
SUTURE ETHLN 4-0 FS2 18XMF BLK (SUTURE) ×1 IMPLANT
TUBING ARTHRO INFLOW-ONLY STRL (TUBING) ×2 IMPLANT
WAND HAND CNTRL MULTIVAC 50 (MISCELLANEOUS) ×2 IMPLANT

## 2015-07-26 NOTE — Anesthesia Procedure Notes (Signed)
Procedure Name: LMA Insertion Performed by: Zunairah Devers Pre-anesthesia Checklist: Patient identified, Patient being monitored, Timeout performed, Emergency Drugs available and Suction available Patient Re-evaluated:Patient Re-evaluated prior to inductionOxygen Delivery Method: Circle system utilized Preoxygenation: Pre-oxygenation with 100% oxygen Intubation Type: IV induction Ventilation: Mask ventilation without difficulty LMA: LMA inserted LMA Size: 4.5 Tube type: Oral Number of attempts: 1 Placement Confirmation: positive ETCO2 and breath sounds checked- equal and bilateral Tube secured with: Tape Dental Injury: Teeth and Oropharynx as per pre-operative assessment      

## 2015-07-26 NOTE — Transfer of Care (Signed)
Immediate Anesthesia Transfer of Care Note  Patient: Jason Collier  Procedure(s) Performed: Procedure(s): KNEE ARTHROSCOPY WITH LATERAL MENISECTOMY, DEBRIDEMENT OF FAT PAD (Right)  Patient Location: PACU  Anesthesia Type:General  Level of Consciousness: awake  Airway & Oxygen Therapy: Patient Spontanous Breathing and Patient connected to face mask oxygen  Post-op Assessment: Report given to RN  Post vital signs: Reviewed  Last Vitals:  Filed Vitals:   07/26/15 1405 07/26/15 1406  BP: 101/40 101/38  Pulse: 61 63  Temp: 36.2 C 36.2 C  Resp: 14 14    Complications: No apparent anesthesia complications

## 2015-07-26 NOTE — Discharge Instructions (Signed)
AMBULATORY SURGERY  DISCHARGE INSTRUCTIONS   1) The drugs that you were given will stay in your system until tomorrow so for the next 24 hours you should not:  A) Drive an automobile B) Make any legal decisions C) Drink any alcoholic beverage   2) You may resume regular meals tomorrow.  Today it is better to start with liquids and gradually work up to solid foods.  You may eat anything you prefer, but it is better to start with liquids, then soup and crackers, and gradually work up to solid foods.   3) Please notify your doctor immediately if you have any unusual bleeding, trouble breathing, redness and pain at the surgery site, drainage, fever, or pain not relieved by medication. 4)   5) Your post-operative visit with Dr.                                     is: Date:                        Time:    Please call to schedule your post-operative visit.  6) Additional Instructions: 7) Weight bearing as tolerated Keep bandage clean and dry

## 2015-07-26 NOTE — Op Note (Signed)
07/26/2015  1:56 PM  PATIENT:  Midge AverAlvin E Andonian  25 y.o. male  PRE-OPERATIVE DIAGNOSIS:  hoffas syndrome,complete tear of lateral meniscus   POST-OPERATIVE DIAGNOSIS:  Fat pad impingement and patellar subluxation  PROCEDURE:  Procedure(s): KNEE ARTHROSCOPY WITH LATERAL RELEASE DEBRIDEMENT OF FAT PAD (Right)  SURGEON: Leitha SchullerMichael J Faith Branan, MD  ASSISTANTS: None  ANESTHESIA:   general  EBL:  Total I/O In: 600 [I.V.:600] Out: -   BLOOD ADMINISTERED:none  DRAINS: none   LOCAL MEDICATIONS USED:  MARCAINE     SPECIMEN:  No Specimen  DISPOSITION OF SPECIMEN:  N/A  COUNTS:  YES  TOURNIQUET:    IMPLANTS: None  DICTATION: .Dragon Dictation the patient brought the operating room and after adequate general anesthesia was obtained the right leg was prepped and draped in sterile fashion was turned by the upper thigh. After patient identification and timeout procedures were completed, inferolateral portal was made. Initial inspection revealed patellar subluxation with a tight lateral retinaculum. Coming around medially and inferior medial portal was made in the medial compartment was normal except for some fat pad impinging into the notch and medial side. Coming onto the notch the anterior cruciate ligament is intact lateral meniscus was intact and normal in appearance gutters were free of any loose body at this point the fat pad was debrided as there was impingement after this was adequately resected there is no more impingement on the joint a lateral releases carried out with an ArthroCare wand. After adequate release and significant improvement in patellar alignment on the in a slightly flexed view of the knee was irrigated until clear. The wounds were closed with simple 4-0 nylon and the local anesthetic was infiltrated and of the 2 portals and area of the lateral release total of 30 cc the wound was dressed with Xeroform 4 x 4's web roll and Ace wrap  PLAN OF CARE: Discharge to home after  PACU  PATIENT DISPOSITION:  PACU - hemodynamically stable.

## 2015-07-26 NOTE — H&P (Signed)
Reviewed paper H+P, will be scanned into chart. No changes noted.  

## 2015-07-26 NOTE — Anesthesia Preprocedure Evaluation (Signed)
Anesthesia Evaluation  Patient identified by MRN, date of birth, ID band Patient awake    Reviewed: Allergy & Precautions, H&P , NPO status , Patient's Chart, lab work & pertinent test results  Airway Mallampati: II  TM Distance: >3 FB Neck ROM: Full    Dental no notable dental hx. (+) Poor Dentition, Chipped   Pulmonary neg shortness of breath, asthma , Current Smoker,    Pulmonary exam normal breath sounds clear to auscultation       Cardiovascular Exercise Tolerance: Good (-) angina(-) Past MI and (-) DOE negative cardio ROS Normal cardiovascular exam Rhythm:regular Rate:Normal     Neuro/Psych Anxiety negative neurological ROS  negative psych ROS   GI/Hepatic GERD  Controlled,(+)     substance abuse  cocaine use,   Endo/Other  negative endocrine ROS  Renal/GU Renal diseaseHx of kidney infection in the past  negative genitourinary   Musculoskeletal Right meniscus tear   Abdominal Normal abdominal exam  (+)   Peds negative pediatric ROS (+)  Hematology negative hematology ROS (+)   Anesthesia Other Findings Past Medical History:   Kidney infection                                2015           Comment:history of   Anxiety                                                      Environmental and seasonal allergies                           Comment:can lead to brochitis  Past Surgical History:   NO PAST SURGERIES                                            BMI    Body Mass Index   33.48 kg/m 2      Reproductive/Obstetrics negative OB ROS                             Anesthesia Physical  Anesthesia Plan  ASA: III  Anesthesia Plan: General LMA   Post-op Pain Management:    Induction: Intravenous  Airway Management Planned: LMA  Additional Equipment:   Intra-op Plan:   Post-operative Plan: Extubation in OR  Informed Consent: I have reviewed the patients History and  Physical, chart, labs and discussed the procedure including the risks, benefits and alternatives for the proposed anesthesia with the patient or authorized representative who has indicated his/her understanding and acceptance.   Dental advisory given  Plan Discussed with: CRNA, Surgeon and Anesthesiologist  Anesthesia Plan Comments:         Anesthesia Quick Evaluation

## 2015-07-27 ENCOUNTER — Emergency Department
Admission: EM | Admit: 2015-07-27 | Discharge: 2015-07-27 | Disposition: A | Discharge status: Home / Self Care | Payer: Self-pay | Attending: Emergency Medicine | Admitting: Emergency Medicine

## 2015-07-27 ENCOUNTER — Encounter: Payer: Self-pay | Admitting: Emergency Medicine

## 2015-07-27 DIAGNOSIS — F17210 Nicotine dependence, cigarettes, uncomplicated: Secondary | ICD-10-CM | POA: Insufficient documentation

## 2015-07-27 DIAGNOSIS — M25561 Pain in right knee: Secondary | ICD-10-CM | POA: Insufficient documentation

## 2015-07-27 MED ORDER — SODIUM CHLORIDE 0.9 % IV BOLUS (SEPSIS)
1000.0000 mL | Freq: Once | INTRAVENOUS | Status: AC
  Administered 2015-07-27: 1000 mL via INTRAVENOUS

## 2015-07-27 MED ORDER — HYDROMORPHONE HCL 1 MG/ML IJ SOLN
INTRAMUSCULAR | Status: AC
  Filled 2015-07-27: qty 1

## 2015-07-27 MED ORDER — HYDROMORPHONE HCL 1 MG/ML IJ SOLN
1.0000 mg | Freq: Once | INTRAMUSCULAR | Status: AC
  Administered 2015-07-27: 1 mg via INTRAVENOUS

## 2015-07-27 MED ORDER — OXYCODONE HCL 5 MG PO TABS
ORAL_TABLET | ORAL | Status: DC
Start: 2015-07-27 — End: 2020-02-11

## 2015-07-27 MED ORDER — ONDANSETRON HCL 4 MG/2ML IJ SOLN
INTRAMUSCULAR | Status: AC
  Filled 2015-07-27: qty 2

## 2015-07-27 MED ORDER — ONDANSETRON HCL 4 MG/2ML IJ SOLN
4.0000 mg | Freq: Once | INTRAMUSCULAR | Status: AC
  Administered 2015-07-27: 4 mg via INTRAVENOUS

## 2015-07-27 MED ORDER — HYDROMORPHONE HCL 1 MG/ML IJ SOLN: 1.0000 mg | Freq: Once | INTRAMUSCULAR | Status: DC

## 2015-07-27 MED ORDER — ONDANSETRON 4 MG PO TBDP: 4.0000 mg | ORAL_TABLET | Freq: Three times a day (TID) | ORAL | Status: DC | PRN

## 2015-07-27 MED ORDER — SODIUM CHLORIDE 0.9 % IV SOLN: 1000.0000 mL | Freq: Once | INTRAVENOUS | Status: DC

## 2015-07-27 MED ORDER — KETOROLAC TROMETHAMINE 30 MG/ML IJ SOLN
30.0000 mg | Freq: Once | INTRAMUSCULAR | Status: AC
  Administered 2015-07-27: 30 mg via INTRAVENOUS
  Filled 2015-07-27: qty 1

## 2015-07-27 MED ORDER — ONDANSETRON HCL 4 MG/2ML IJ SOLN: 4.0000 mg | Freq: Once | INTRAMUSCULAR | Status: DC

## 2015-07-27 NOTE — ED Provider Notes (Signed)
[facility] Emergency Department Provider Note ____________________________________________  Time seen: Approximately 5:13 PM  I have reviewed the triage vital signs and the nursing notes.   HISTORY  Chief Complaint Post-op Problem    HPI Jason Collier is a 25 y.o. male who presents to the ER in pain on post-op day one from a R knee arthroscopy with lateral debridement of fat pad. He states the pain began last night and he started vomiting and has been unable to keep down his hydrocodone.  Pain is worse with any movement of the leg. He denies any fevers, chest pain, shortness of breath. He states he has not been on pain meds on a regular basis in the past.     Past Medical History  Diagnosis Date  . Kidney infection 2015    history of  . Anxiety   . Environmental and seasonal allergies     can lead to brochitis    There are no active problems to display for this patient.   Past Surgical History  Procedure Laterality Date  . No past surgeries    . Knee arthroscopy with lateral menisectomy Right 07/26/2015    Procedure: KNEE ARTHROSCOPY WITH LATERAL MENISECTOMY, DEBRIDEMENT OF FAT PAD;  Surgeon: Kennedy BuckerMichael Menz, MD;  Location: [facility] ORS;  Service: Orthopedics;  Laterality: Right;    Current Outpatient Rx  Name  Route  Sig  Dispense  Refill  . acetaminophen (TYLENOL) 325 MG tablet   Oral   Take 325 mg by mouth every 6 (six) hours as needed for headache.         . albuterol-ipratropium (COMBIVENT) 18-103 MCG/ACT inhaler   Inhalation   Inhale 1-2 puffs into the lungs every 6 (six) hours as needed for wheezing or shortness of breath.         . clonazePAM (KLONOPIN) 1 MG tablet   Oral   Take 1 mg by mouth at bedtime as needed for anxiety.         Marland Kitchen. CLONIDINE HCL PO   Oral   Take 1 tablet by mouth 2 (two) times daily as needed (anxiety). Reported on 07/26/2015         . cyclobenzaprine (FLEXERIL) 10 MG tablet   Oral   Take 10 mg by mouth 3  (three) times daily as needed for muscle spasms. Reported on 07/26/2015         . HYDROcodone-acetaminophen (NORCO) 5-325 MG tablet   Oral   Take 1 tablet by mouth every 6 (six) hours as needed for moderate pain.   30 tablet   0   . ibuprofen (ADVIL,MOTRIN) 800 MG tablet   Oral   Take 200 mg by mouth every 8 (eight) hours as needed for moderate pain.         . meclizine (ANTIVERT) 25 MG tablet   Oral   Take 25 mg by mouth 3 (three) times daily as needed for dizziness.         . ondansetron (ZOFRAN ODT) 4 MG disintegrating tablet   Oral   Take 1 tablet (4 mg total) by mouth every 8 (eight) hours as needed for nausea or vomiting.   20 tablet   0   . oxyCODONE (ROXICODONE) 5 MG immediate release tablet      1-2 tabs every 6 hours as needed for severe pain. Do not drive or operate machinery while on this medication. Use stool softeners as medication may cause constipation.   20 tablet   0   .  sertraline (ZOLOFT) 50 MG tablet   Oral   Take 50 mg by mouth daily. Reported on 07/26/2015           Allergies Tramadol  No family history on file.  Social History Social History  Substance Use Topics  . Smoking status: Current Every Day Smoker -- 0.50 packs/day    Types: Cigarettes  . Smokeless tobacco: Former Neurosurgeon  . Alcohol Use: 1.2 oz/week    2 Cans of beer per week     Comment: every three days    Review of Systems Constitutional: No fever Cardiovascular: Denies chest pain. Respiratory: Denies shortness of breath. Gastrointestinal: No abdominal pain.    10-point ROS otherwise negative.  ____________________________________________   PHYSICAL EXAM:  VITAL SIGNS: ED Triage Vitals  Enc Vitals Group     BP 07/27/15 1656 163/88 mmHg     Pulse Rate 07/27/15 1656 72     Resp 07/27/15 1656 20     Temp 07/27/15 1656 98.2 F (36.8 C)     Temp Source 07/27/15 1656 Oral     SpO2 07/27/15 1656 98 %     Weight 07/27/15 1656 250 lb (113.399 kg)     Height  07/27/15 1656  (1.803 m)     Head Cir --      Peak Flow --      Pain Score 07/27/15 1657 10     Pain Loc --      Pain Edu? --      Excl. in GC? --    Constitutional: Alert and oriented. Well appearing. Moaning in pain Eyes: Conjunctivae are normal. PERRL. EOMI. Head: Atraumatic. Nose: No congestion/rhinnorhea. Mouth/Throat: Mucous membranes are moist.  Oropharynx non-erythematous. Neck: No stridor.   Cardiovascular: Normal rate, regular rhythm. Grossly normal heart sounds.  Good peripheral circulation. Respiratory: Normal respiratory effort.  No retractions. Lungs CTAB. Gastrointestinal: Soft and nontender. No distention. No abdominal bruits. No CVA tenderness. Musculoskeletal: R knee partially flexed Neurologic:  Normal speech and language. No gross focal neurologic deficits are appreciated. No gait instability. Skin:  Skin is warm, dry and intact. No rash noted. Psychiatric: Mood and affect are normal. Speech and behavior are normal.  ____________________________________________   ____________________________________________   INITIAL IMPRESSION / ASSESSMENT AND PLAN / ED COURSE  Pertinent labs & imaging results that were available during my care of the patient were reviewed by me and considered in my medical decision making (see chart for details).  ----------------------------------------- 5:48 PM on 07/27/2015 -----------------------------------------  I removed the dressing from knee after patient received Dilaudid. He states he is still in a lot of pain. Wounds are clean dry and intact with one suture each. No surrounding erythema or edema. Moderate knee effusion.  No calf swelling or tenderness.  ----------------------------------------- 7:03 PM on 07/27/2015 -----------------------------------------  Patient significantly improved. His mother is now at the bedside. She states that in the past hydrocodone has never worked very well for her son. When he had a  bruised spleen at age 53 oxycodone and it better. She states she is not concerned about narcotic addiction as he has never had any problems with alcohol or drugs. She states he has a very good support system. He has a follow-up appointment with Dr. Rosita Kea in 2 days. ____________________________________________   FINAL CLINICAL IMPRESSION(S) / ED DIAGNOSES  Final diagnoses:  Knee pain, acute, right      New prescriptions started this visit New Prescriptions   ONDANSETRON (ZOFRAN ODT) 4 MG DISINTEGRATING TABLET  Take 1 tablet (4 mg total) by mouth every 8 (eight) hours as needed for nausea or vomiting.   OXYCODONE (ROXICODONE) 5 MG IMMEDIATE RELEASE TABLET    1-2 tabs every 6 hours as needed for severe pain. Do not drive or operate machinery while on this medication. Use stool softeners as medication may cause constipation.     Maurilio Lovely, MD 07/27/15 [phone]

## 2015-07-27 NOTE — ED Notes (Signed)
MD at bedside. 

## 2015-07-27 NOTE — ED Notes (Signed)

## 2015-07-27 NOTE — Discharge Instructions (Signed)
Return to the ER for new or worsening pain, increased swelling, pain in her calf, numbness of your foot, blue or pale discoloration of your foot, or for any other concerns.  Take your nausea medications 30-45 min before your pain medications to avoid vomiting.

## 2015-07-27 NOTE — ED Notes (Signed)
Iv started  meds given.  siderails up x 2.  

## 2015-07-27 NOTE — ED Notes (Signed)
Pt presents with right knee pain after having surgery yesterday. States pain meds they gave him are not helping with the pain. Dr Rosita KeaMenz performed the surgery.

## 2015-07-27 NOTE — ED Notes (Signed)
Pt had surgery on right knee yesterday by dr Rosita Keamenz.  Pt states pain no better and pt has n/v today.  Pt has ace bandage on right knee.

## 2015-07-27 NOTE — ED Notes (Signed)
Pain meds given again.  md in with pt.  Ace bandage removed by md.  No signs of infection noted.  Right knee elevated on pillow.

## 2015-07-27 NOTE — Anesthesia Postprocedure Evaluation (Signed)
Anesthesia Post Note  Patient: Midge Averlvin E Attig  Procedure(s) Performed: Procedure(s) (LRB): KNEE ARTHROSCOPY WITH LATERAL MENISECTOMY, DEBRIDEMENT OF FAT PAD (Right)  Patient location during evaluation: PACU Anesthesia Type: General Level of consciousness: awake and alert Pain management: pain level controlled Vital Signs Assessment: post-procedure vital signs reviewed and stable Respiratory status: spontaneous breathing, nonlabored ventilation, respiratory function stable and patient connected to nasal cannula oxygen Cardiovascular status: blood pressure returned to baseline and stable Postop Assessment: no signs of nausea or vomiting Anesthetic complications: no    Last Vitals:  Filed Vitals:   07/26/15 1527 07/26/15 1552  BP: 134/62 120/72  Pulse: 50 51  Temp:    Resp: 16 16    Last Pain:  Filed Vitals:   07/27/15 1009  PainSc: 8                  Joseph K Piscitello

## 2015-07-27 NOTE — ED Notes (Signed)
Right knee rebandaged by rn.  Mother with pt.

## 2015-12-14 ENCOUNTER — Emergency Department
Admission: EM | Admit: 2015-12-14 | Discharge: 2015-12-14 | Disposition: A | Discharge status: Home / Self Care | Payer: Self-pay | Attending: Student | Admitting: Student

## 2015-12-14 ENCOUNTER — Encounter: Payer: Self-pay | Admitting: Intensive Care

## 2015-12-14 DIAGNOSIS — R1110 Vomiting, unspecified: Secondary | ICD-10-CM

## 2015-12-14 DIAGNOSIS — R1084 Generalized abdominal pain: Secondary | ICD-10-CM

## 2015-12-14 DIAGNOSIS — Z79899 Other long term (current) drug therapy: Secondary | ICD-10-CM | POA: Insufficient documentation

## 2015-12-14 DIAGNOSIS — R197 Diarrhea, unspecified: Secondary | ICD-10-CM | POA: Insufficient documentation

## 2015-12-14 DIAGNOSIS — F17210 Nicotine dependence, cigarettes, uncomplicated: Secondary | ICD-10-CM | POA: Insufficient documentation

## 2015-12-14 DIAGNOSIS — R112 Nausea with vomiting, unspecified: Secondary | ICD-10-CM | POA: Insufficient documentation

## 2015-12-14 LAB — COMPREHENSIVE METABOLIC PANEL
ALBUMIN: 4.5 g/dL (ref 3.5–5.0)
ALK PHOS: 61 U/L (ref 38–126)
ALT: 45 U/L (ref 17–63)
ANION GAP: 5 (ref 5–15)
AST: 44 U/L — AB (ref 15–41)
BILIRUBIN TOTAL: 0.6 mg/dL (ref 0.3–1.2)
BUN: 10 mg/dL (ref 6–20)
CALCIUM: 9.3 mg/dL (ref 8.9–10.3)
CO2: 27 mmol/L (ref 22–32)
Chloride: 106 mmol/L (ref 101–111)
Creatinine, Ser: 1.22 mg/dL (ref 0.61–1.24)
GFR calc Af Amer: >60 mL/min (ref >60)
GFR calc non Af Amer: >60 mL/min (ref >60)
GLUCOSE: 105 mg/dL — AB (ref 65–99)
Potassium: 4.3 mmol/L (ref 3.5–5.1)
SODIUM: 138 mmol/L (ref 135–145)
TOTAL PROTEIN: 7.8 g/dL (ref 6.5–8.1)

## 2015-12-14 LAB — URINALYSIS COMPLETE WITH MICROSCOPIC (ARMC ONLY)
BACTERIA UA: NONE SEEN
Bilirubin Urine: NEGATIVE
GLUCOSE, UA: NEGATIVE mg/dL
HGB URINE DIPSTICK: NEGATIVE
KETONES UR: NEGATIVE mg/dL
Leukocytes, UA: NEGATIVE
Nitrite: NEGATIVE
PH: 5 (ref 5.0–8.0)
Protein, ur: NEGATIVE mg/dL
RBC / HPF: NONE SEEN RBC/hpf (ref 0–5)
Specific Gravity, Urine: 1.024 (ref 1.005–1.030)

## 2015-12-14 LAB — CBC
HCT: 46.9 % (ref 40.0–52.0)
HEMOGLOBIN: 16.5 g/dL (ref 13.0–18.0)
MCH: 28.9 pg (ref 26.0–34.0)
MCHC: 35.2 g/dL (ref 32.0–36.0)
MCV: 82.1 fL (ref 80.0–100.0)
Platelets: 205 10*3/uL (ref 150–440)
RBC: 5.71 MIL/uL (ref 4.40–5.90)
RDW: 15 % — AB (ref 11.5–14.5)
WBC: 6.9 10*3/uL (ref 3.8–10.6)

## 2015-12-14 LAB — LIPASE, BLOOD: Lipase: 21 U/L (ref 11–51)

## 2015-12-14 MED ORDER — ONDANSETRON 4 MG PO TBDP: 4.0000 mg | ORAL_TABLET | Freq: Three times a day (TID) | ORAL | 0 refills | Status: DC | PRN

## 2015-12-14 MED ORDER — SODIUM CHLORIDE 0.9 % IV BOLUS (SEPSIS)
1000.0000 mL | Freq: Once | INTRAVENOUS | Status: AC
  Administered 2015-12-14: 1000 mL via INTRAVENOUS

## 2015-12-14 MED ORDER — KETOROLAC TROMETHAMINE 30 MG/ML IJ SOLN
15.0000 mg | Freq: Once | INTRAMUSCULAR | Status: AC
  Administered 2015-12-14: 15 mg via INTRAVENOUS

## 2015-12-14 MED ORDER — ONDANSETRON HCL 4 MG/2ML IJ SOLN
4.0000 mg | Freq: Once | INTRAMUSCULAR | Status: AC | PRN
  Administered 2015-12-14: 4 mg via INTRAVENOUS

## 2015-12-14 MED ORDER — ONDANSETRON HCL 4 MG/2ML IJ SOLN
4.0000 mg | Freq: Once | INTRAMUSCULAR | Status: AC
  Administered 2015-12-14: 4 mg via INTRAVENOUS
  Filled 2015-12-14: qty 2

## 2015-12-14 MED ORDER — ONDANSETRON HCL 4 MG/2ML IJ SOLN
INTRAMUSCULAR | Status: AC
  Administered 2015-12-14: 4 mg via INTRAVENOUS
  Filled 2015-12-14: qty 2

## 2015-12-14 NOTE — ED Notes (Signed)
Pt tolerated sprite. 

## 2015-12-14 NOTE — ED Triage Notes (Signed)
Patient presents to ER with c/o diarrhea, nausea, vomiting X4 days. Pt reports it started with a cold and then other symptoms began. Pt c/o pain in lower abdomen and lower back. Patient has had two episodes of diarrhea since arriving. Pt ambulatory in triage

## 2015-12-14 NOTE — ED Triage Notes (Signed)
Pt reports n/v/d for four days. NAD, pt ambulatory to stat desk.

## 2015-12-14 NOTE — ED Provider Notes (Signed)
[facility] Emergency Department Provider Note   ____________________________________________   First MD Initiated Contact with Patient 12/14/15 [phone]     (approximate)  I have reviewed the triage vital signs and the nursing notes.   HISTORY  Chief Complaint Emesis and Diarrhea    HPI Jason Collier is a 25 y.o. male with history of anxiety, no other chronic medical problems who presents for evaluation of 4 days of generalized abdominal pain, recurrent nonbloody nonbilious emesis as well as recurrent nonbloody diarrhea, gradual onset, constant, moderate to severe, no modifying factors. He reports he has also had subjective fevers and chills as well as nonproductive cough, sneezing and some runny nose. His girlfriend was sick last week with upper respiratory tract infection symptoms. No pain or burning with urination. Denies chest pain or shortness of breath.   Past Medical History:  Diagnosis Date  . Anxiety   . Environmental and seasonal allergies    can lead to brochitis  . Kidney infection 2015   history of    There are no active problems to display for this patient.   Past Surgical History:  Procedure Laterality Date  . KNEE ARTHROSCOPY WITH LATERAL MENISECTOMY Right 07/26/2015   Procedure: KNEE ARTHROSCOPY WITH LATERAL MENISECTOMY, DEBRIDEMENT OF FAT PAD;  Surgeon: Kennedy Bucker, MD;  Location: [facility] ORS;  Service: Orthopedics;  Laterality: Right;  . NO PAST SURGERIES      Prior to Admission medications   Medication Sig Start Date End Date Taking? Authorizing Provider  acetaminophen (TYLENOL) 325 MG tablet Take 325 mg by mouth every 6 (six) hours as needed for headache.    Historical Provider, MD  albuterol-ipratropium (COMBIVENT) 18-103 MCG/ACT inhaler Inhale 1-2 puffs into the lungs every 6 (six) hours as needed for wheezing or shortness of breath.    Historical Provider, MD  clonazePAM (KLONOPIN) 1 MG tablet Take 1 mg by mouth at bedtime as  needed for anxiety.    Historical Provider, MD  CLONIDINE HCL PO Take 1 tablet by mouth 2 (two) times daily as needed (anxiety). Reported on 07/26/2015    Historical Provider, MD  cyclobenzaprine (FLEXERIL) 10 MG tablet Take 10 mg by mouth 3 (three) times daily as needed for muscle spasms. Reported on 07/26/2015    Historical Provider, MD  HYDROcodone-acetaminophen (NORCO) 5-325 MG tablet Take 1 tablet by mouth every 6 (six) hours as needed for moderate pain. 07/26/15   Kennedy Bucker, MD  ibuprofen (ADVIL,MOTRIN) 800 MG tablet Take 200 mg by mouth every 8 (eight) hours as needed for moderate pain.    Historical Provider, MD  meclizine (ANTIVERT) 25 MG tablet Take 25 mg by mouth 3 (three) times daily as needed for dizziness.    Historical Provider, MD  ondansetron (ZOFRAN ODT) 4 MG disintegrating tablet Take 1 tablet (4 mg total) by mouth every 8 (eight) hours as needed for nausea or vomiting. 12/14/15   Gayla Doss, MD  oxyCODONE (ROXICODONE) 5 MG immediate release tablet 1-2 tabs every 6 hours as needed for severe pain. Do not drive or operate machinery while on this medication. Use stool softeners as medication may cause constipation. 07/27/15   Maurilio Lovely, MD  sertraline (ZOLOFT) 50 MG tablet Take 50 mg by mouth daily. Reported on 07/26/2015    Historical Provider, MD    Allergies Tramadol and Hydrocodone  History reviewed. No pertinent family history.  Social History Social History  Substance Use Topics  . Smoking status: Current Every Day Smoker  Packs/day: 0.50    Types: Cigarettes  . Smokeless tobacco: Former NeurosurgeonUser  . Alcohol use 1.2 oz/week    2 Cans of beer per week     Comment: every three days    Review of Systems Constitutional: +subjective fever/chills Eyes: No visual changes. ENT: No sore throat. Cardiovascular: Denies chest pain. Respiratory: Denies shortness of breath. Gastrointestinal: + abdominal pain.  + nausea, + vomiting.  + diarrhea.  No  constipation. Genitourinary: Negative for dysuria. Musculoskeletal: Negative for back pain. Skin: Negative for rash. Neurological: Negative for headaches, focal weakness or numbness.  10-point ROS otherwise negative.  ____________________________________________   PHYSICAL EXAM:  VITAL SIGNS: ED Triage Vitals  Enc Vitals Group     BP 12/14/15 0921 127/78     Pulse Rate 12/14/15 0921 75     Resp 12/14/15 0921 20     Temp 12/14/15 0921 98.3 F (36.8 C)     Temp Source 12/14/15 0915 Oral     SpO2 12/14/15 0921 98 %     Weight 12/14/15 0924 256 lb (116.1 kg)     Height 12/14/15 0924 5' 10.5" (1.791 m)     Head Circumference --      Peak Flow --      Pain Score 12/14/15 0925 6     Pain Loc --      Pain Edu? --      Excl. in GC? --     Constitutional: Alert and oriented. Nontoxic-appearing and in no acute distress. Eyes: Conjunctivae are normal. PERRL. EOMI. Head: Atraumatic. Nose: No congestion/rhinnorhea. Mouth/Throat: Mucous membranes are moist.  Oropharynx non-erythematous. Neck: No stridor.   Cardiovascular: Normal rate, regular rhythm. Grossly normal heart sounds.  Good peripheral circulation. Respiratory: Normal respiratory effort.  No retractions. Lungs CTAB. Gastrointestinal: Soft and nontender, normal bowel sounds. No distention.  No CVA tenderness. Genitourinary: deferred Musculoskeletal: No lower extremity tenderness nor edema.  No joint effusions. Neurologic:  Normal speech and language. No gross focal neurologic deficits are appreciated. No gait instability. Skin:  Skin is warm, dry and intact. No rash noted. Psychiatric: Mood and affect are normal. Speech and behavior are normal.  ____________________________________________   LABS (all labs ordered are listed, but only abnormal results are displayed)  Labs Reviewed  COMPREHENSIVE METABOLIC PANEL - Abnormal; Notable for the following:       Result Value   Glucose, Bld 105 (*)    AST 44 (*)    All  other components within normal limits  CBC - Abnormal; Notable for the following:    RDW 15.0 (*)    All other components within normal limits  URINALYSIS COMPLETEWITH MICROSCOPIC ([facility] ONLY) - Abnormal; Notable for the following:    Color, Urine YELLOW (*)    APPearance CLEAR (*)    Squamous Epithelial / LPF 0-5 (*)    All other components within normal limits  URINE CULTURE  LIPASE, BLOOD   ____________________________________________  EKG  none ____________________________________________  RADIOLOGY  none ____________________________________________   PROCEDURES  Procedure(s) performed: None  Procedures  Critical Care performed: No  ____________________________________________   INITIAL IMPRESSION / ASSESSMENT AND PLAN / ED COURSE  Pertinent labs & imaging results that were available during my care of the patient were reviewed by me and considered in my medical decision making (see chart for details).  Midge Averlvin E Vilar is a 25 y.o. male with history of anxiety, no other chronic medical problems who presents for evaluation of 4 days of generalized abdominal pain, recurrent  nonbloody nonbilious emesis as well as recurrent nonbloody diarrhea. On exam, he is nontoxic appearing and in no acute distress. His vital signs are stable and he is afebrile. He is a benign cardiopulmonary examination and a benign abdominal examination without rebound, guarding, tenderness or rigidity. Suspect viral illness given his constellation of symptoms, we'll obtain screening abdominal pain labs, urinalysis, give IV fluids, treat with Toradol and Zofran and reassess for disposition.  ----------------------------------------- 10:47 AM on 12/14/2015 ----------------------------------------- Patient continues to appear well, sitting up in bed and talking to his family at bedside however reports that he vomited/failed by mouth challenge. Will repeat dose Zofran and  reassess.  ----------------------------------------- 2:27 PM on 12/14/2015 ----------------------------------------- Patient reports he feels much better, is no longer nauseated, tolerating by mouth intake without vomiting. CBC, CMP, lipase unremarkable. Urinalysis with 0-5 white blood cells however the patient denies any urinary complaints and UA is otherwise unremarkable, urine culture sent. Suspect viral syndrome. We discussed meticulous return precautions, need for close PCP follow-up and he is comfortable with the discharge plan. DC home.  Clinical Course     ____________________________________________   FINAL CLINICAL IMPRESSION(S) / ED DIAGNOSES  Final diagnoses:  Generalized abdominal pain  Vomiting and diarrhea      NEW MEDICATIONS STARTED DURING THIS VISIT:  Discharge Medication List as of 12/14/2015  2:33 PM       Note:  This document was prepared using Dragon voice recognition software and may include unintentional dictation errors.    Gayla Doss, MD 12/14/15 [phone]

## 2015-12-15 LAB — URINE CULTURE

## 2016-03-18 ENCOUNTER — Emergency Department (HOSPITAL_COMMUNITY): Payer: Self-pay

## 2016-03-18 ENCOUNTER — Inpatient Hospital Stay (HOSPITAL_COMMUNITY)
Admission: EM | Admit: 2016-03-18 | Discharge: 2016-03-20 | DRG: 896 | Disposition: A | Discharge status: Home / Self Care | Payer: Self-pay | Attending: Pulmonary Disease | Admitting: Pulmonary Disease

## 2016-03-18 DIAGNOSIS — F1290 Cannabis use, unspecified, uncomplicated: Secondary | ICD-10-CM | POA: Diagnosis present

## 2016-03-18 DIAGNOSIS — Z791 Long term (current) use of non-steroidal anti-inflammatories (NSAID): Secondary | ICD-10-CM

## 2016-03-18 DIAGNOSIS — Z885 Allergy status to narcotic agent status: Secondary | ICD-10-CM

## 2016-03-18 DIAGNOSIS — F17200 Nicotine dependence, unspecified, uncomplicated: Secondary | ICD-10-CM | POA: Diagnosis present

## 2016-03-18 DIAGNOSIS — Z79899 Other long term (current) drug therapy: Secondary | ICD-10-CM

## 2016-03-18 DIAGNOSIS — J9811 Atelectasis: Secondary | ICD-10-CM | POA: Diagnosis present

## 2016-03-18 DIAGNOSIS — F10129 Alcohol abuse with intoxication, unspecified: Principal | ICD-10-CM | POA: Diagnosis present

## 2016-03-18 DIAGNOSIS — R402431 Glasgow coma scale score 3-8, in the field [EMT or ambulance]: Secondary | ICD-10-CM | POA: Diagnosis present

## 2016-03-18 DIAGNOSIS — T17908A Unspecified foreign body in respiratory tract, part unspecified causing other injury, initial encounter: Secondary | ICD-10-CM

## 2016-03-18 DIAGNOSIS — Z9889 Other specified postprocedural states: Secondary | ICD-10-CM

## 2016-03-18 DIAGNOSIS — Y906 Blood alcohol level of 120-199 mg/100 ml: Secondary | ICD-10-CM | POA: Diagnosis present

## 2016-03-18 DIAGNOSIS — G92 Toxic encephalopathy: Secondary | ICD-10-CM | POA: Diagnosis present

## 2016-03-18 DIAGNOSIS — Z4659 Encounter for fitting and adjustment of other gastrointestinal appliance and device: Secondary | ICD-10-CM

## 2016-03-18 DIAGNOSIS — E872 Acidosis: Secondary | ICD-10-CM | POA: Diagnosis present

## 2016-03-18 DIAGNOSIS — R1110 Vomiting, unspecified: Secondary | ICD-10-CM | POA: Diagnosis present

## 2016-03-18 DIAGNOSIS — W19XXXA Unspecified fall, initial encounter: Secondary | ICD-10-CM | POA: Diagnosis present

## 2016-03-18 DIAGNOSIS — J9601 Acute respiratory failure with hypoxia: Secondary | ICD-10-CM | POA: Diagnosis present

## 2016-03-18 DIAGNOSIS — Y92481 Parking lot as the place of occurrence of the external cause: Secondary | ICD-10-CM

## 2016-03-18 DIAGNOSIS — R221 Localized swelling, mass and lump, neck: Secondary | ICD-10-CM

## 2016-03-18 DIAGNOSIS — F419 Anxiety disorder, unspecified: Secondary | ICD-10-CM | POA: Diagnosis present

## 2016-03-18 DIAGNOSIS — T17918A Gastric contents in respiratory tract, part unspecified causing other injury, initial encounter: Secondary | ICD-10-CM | POA: Diagnosis present

## 2016-03-18 LAB — I-STAT CG4 LACTIC ACID, ED: Lactic Acid, Venous: 2.92 mmol/L (ref 0.5–1.9)

## 2016-03-18 LAB — COMPREHENSIVE METABOLIC PANEL
ALBUMIN: 4.2 g/dL (ref 3.5–5.0)
ALT: 85 U/L — AB (ref 17–63)
ANION GAP: 13 (ref 5–15)
AST: 54 U/L — ABNORMAL HIGH (ref 15–41)
Alkaline Phosphatase: 59 U/L (ref 38–126)
BUN: 8 mg/dL (ref 6–20)
CHLORIDE: 102 mmol/L (ref 101–111)
CO2: 19 mmol/L — AB (ref 22–32)
Calcium: 9.1 mg/dL (ref 8.9–10.3)
Creatinine, Ser: 1.06 mg/dL (ref 0.61–1.24)
GFR calc non Af Amer: >60 mL/min (ref >60)
GLUCOSE: 94 mg/dL (ref 65–99)
Potassium: 3.9 mmol/L (ref 3.5–5.1)
SODIUM: 134 mmol/L — AB (ref 135–145)
Total Bilirubin: 0.5 mg/dL (ref 0.3–1.2)
Total Protein: 7.1 g/dL (ref 6.5–8.1)

## 2016-03-18 LAB — CBC
HCT: 45.1 % (ref 39.0–52.0)
HEMOGLOBIN: 15.5 g/dL (ref 13.0–17.0)
MCH: 28.9 pg (ref 26.0–34.0)
MCHC: 34.4 g/dL (ref 30.0–36.0)
MCV: 84 fL (ref 78.0–100.0)
Platelets: 239 10*3/uL (ref 150–400)
RBC: 5.37 MIL/uL (ref 4.22–5.81)
RDW: 14.5 % (ref 11.5–15.5)
WBC: 10.4 10*3/uL (ref 4.0–10.5)

## 2016-03-18 LAB — I-STAT CHEM 8, ED
BUN: 11 mg/dL (ref 6–20)
CHLORIDE: 106 mmol/L (ref 101–111)
Calcium, Ion: 1.02 mmol/L — ABNORMAL LOW (ref 1.15–1.40)
Creatinine, Ser: 1.3 mg/dL — ABNORMAL HIGH (ref 0.61–1.24)
Glucose, Bld: 96 mg/dL (ref 65–99)
HEMATOCRIT: 48 % (ref 39.0–52.0)
Hemoglobin: 16.3 g/dL (ref 13.0–17.0)
POTASSIUM: 6 mmol/L — AB (ref 3.5–5.1)
SODIUM: 138 mmol/L (ref 135–145)
TCO2: 24 mmol/L (ref 0–100)

## 2016-03-18 LAB — PROTIME-INR
INR: 0.94
Prothrombin Time: 12.6 seconds (ref 11.4–15.2)

## 2016-03-18 LAB — I-STAT ARTERIAL BLOOD GAS, ED
Acid-base deficit: 5 mmol/L — ABNORMAL HIGH (ref 0.0–2.0)
Bicarbonate: 21.5 mmol/L (ref 20.0–28.0)
O2 SAT: 91 %
PCO2 ART: 43 mmHg (ref 32.0–48.0)
TCO2: 23 mmol/L (ref 0–100)
pH, Arterial: 7.306 — ABNORMAL LOW (ref 7.350–7.450)
pO2, Arterial: 67 mmHg — ABNORMAL LOW (ref 83.0–108.0)

## 2016-03-18 LAB — ETHANOL: Alcohol, Ethyl (B): 178 mg/dL — ABNORMAL HIGH (ref <5)

## 2016-03-18 MED ORDER — ONDANSETRON HCL 4 MG/2ML IJ SOLN
4.0000 mg | Freq: Once | INTRAMUSCULAR | Status: AC
  Administered 2016-03-18: 4 mg via INTRAVENOUS

## 2016-03-18 MED ORDER — PROPOFOL 1000 MG/100ML IV EMUL
5.0000 ug/kg/min | Freq: Once | INTRAVENOUS | Status: AC
Start: 2016-03-18 — End: 2016-03-18
  Administered 2016-03-18: 10 ug/kg/min via INTRAVENOUS

## 2016-03-18 MED ORDER — ETOMIDATE 2 MG/ML IV SOLN
INTRAVENOUS | Status: AC | PRN
  Administered 2016-03-18: 30 mg via INTRAVENOUS

## 2016-03-18 MED ORDER — MIDAZOLAM HCL 2 MG/2ML IJ SOLN
INTRAMUSCULAR | Status: AC
  Filled 2016-03-18: qty 6

## 2016-03-18 MED ORDER — SODIUM CHLORIDE 0.9 % IV SOLN
Freq: Once | INTRAVENOUS | Status: AC
  Administered 2016-03-18: 21:00:00 via INTRAVENOUS

## 2016-03-18 MED ORDER — SUCCINYLCHOLINE CHLORIDE 20 MG/ML IJ SOLN
INTRAMUSCULAR | Status: AC | PRN
  Administered 2016-03-18: 100 mg via INTRAVENOUS

## 2016-03-18 MED ORDER — MIDAZOLAM HCL 2 MG/2ML IJ SOLN
5.0000 mg | Freq: Once | INTRAMUSCULAR | Status: AC
  Administered 2016-03-18: 5 mg via INTRAVENOUS

## 2016-03-18 NOTE — ED Provider Notes (Signed)
[facility]-EMERGENCY DEPT Provider Note   CSN: [id number] Arrival date & time: 03/18/16  2058     History   Chief Complaint Chief Complaint  Patient presents with  . Fall  . Alcohol Intoxication    HPI Jason Collier is a 25 y.o. male.  The history is provided by the EMS personnel. The history is limited by the condition of the patient.   This is a 25 year old male who presents today from the parking lot of a bar. He was found down. Reported to have fallen and hit the back of his head on a cement post in the parking lot. He was reported to be intoxicated at the bar. His GCS was initially reported to be 3 by EMS. On arrival the patient has purposeful movements but is not communicative making the history very limited. There is no evidence of outward trauma on inspection. He has had a significant amount of vomiting.    No past medical history on file.  There are no active problems to display for this patient.   No past surgical history on file.     Home Medications    Prior to Admission medications   Medication Sig Start Date End Date Taking? Authorizing Provider  clonazePAM (KLONOPIN) 0.5 MG tablet Take 0.5 mg by mouth 2 (two) times daily as needed for anxiety.   Yes Historical Provider, MD  ibuprofen (ADVIL,MOTRIN) 200 MG tablet Take 200-800 mg by mouth every 6 (six) hours as needed for headache.   Yes Historical Provider, MD    Family History No family history on file.  Social History Social History  Substance Use Topics  . Smoking status: Not on file  . Smokeless tobacco: Not on file  . Alcohol use Not on file     Allergies   Hydrocodone and Tramadol   Review of Systems Review of Systems  Unable to perform ROS: Mental status change     Physical Exam Updated Vital Signs BP 127/85   Pulse 70   Resp 16   Ht 6\' 2"  (1.88 m)   Wt 136.1 kg   SpO2 100%   BMI 38.52 kg/m   Physical Exam  Constitutional: He appears distressed.  Intermittently makes  loud retching and moaning sounds. No organized discernible speech. Patient has significant amount of emesis going down shirt.  HENT:  Head: Normocephalic and atraumatic.  Eyes: Conjunctivae and EOM are normal. Pupils are equal, round, and reactive to light.  Neck: Neck supple.  C collar in place  Pulmonary/Chest: He is in respiratory distress.  Bilateral breath sounds present  Abdominal: Soft. He exhibits no distension.  Musculoskeletal: Normal range of motion. He exhibits no edema.  Psychiatric: His mood appears anxious.     ED Treatments / Results  Labs (all labs ordered are listed, but only abnormal results are displayed) Labs Reviewed  COMPREHENSIVE METABOLIC PANEL - Abnormal; Notable for the following:       Result Value   Sodium 134 (*)    CO2 19 (*)    AST 54 (*)    ALT 85 (*)    All other components within normal limits  ETHANOL - Abnormal; Notable for the following:    Alcohol, Ethyl (B) 178 (*)    All other components within normal limits  I-STAT CHEM 8, ED - Abnormal; Notable for the following:    Potassium 6.0 (*)    Creatinine, Ser 1.30 (*)    Calcium, Ion 1.02 (*)    All other components  within normal limits  I-STAT CG4 LACTIC ACID, ED - Abnormal; Notable for the following:    Lactic Acid, Venous 2.92 (*)    All other components within normal limits  I-STAT ARTERIAL BLOOD GAS, ED - Abnormal; Notable for the following:    pH, Arterial 7.306 (*)    pO2, Arterial 67.0 (*)    Acid-base deficit 5.0 (*)    All other components within normal limits  CBC  PROTIME-INR  CDS SEROLOGY  RAPID HIV SCREEN (HIV 1/2 AB+AG)  HEPATITIS PANEL, ACUTE  RAPID URINE DRUG SCREEN, HOSP PERFORMED  TYPE AND SCREEN  PREPARE FRESH FROZEN PLASMA  ABO/RH    EKG  EKG Interpretation  Date/Time:  Sunday March 18 2016 21:36:53 EST Ventricular Rate:  87 PR Interval:    QRS Duration: 115 QT Interval:  359 QTC Calculation: 432 R Axis:   85 Text Interpretation:  Sinus rhythm  Nonspecific intraventricular conduction delay Early repolarization No old tracing to compare Confirmed by [facility]  MD, DAVID ([id number]) on 03/18/2016 9:42:52 PM       Radiology Ct Head Wo Contrast  Result Date: 03/18/2016 CLINICAL DATA:  25 year old male with level 1 trauma. EXAM: CT HEAD WITHOUT CONTRAST CT CERVICAL SPINE WITHOUT CONTRAST TECHNIQUE: Multidetector CT imaging of the head and cervical spine was performed following the standard protocol without intravenous contrast. Multiplanar CT image reconstructions of the cervical spine were also generated. COMPARISON:  Head CT dated 04/15/2011 FINDINGS: CT HEAD FINDINGS Brain: No evidence of acute infarction, hemorrhage, hydrocephalus, extra-axial collection or mass lesion/mass effect. Vascular: There is high attenuation of the MCAs bilaterally likely related to hemoconcentration. Skull: Normal. Negative for fracture or focal lesion. Sinuses/Orbits: There is opacification of the nasal passages as well as partial opacification of the ethmoid air cells. No air-fluid levels. The mastoid air cells are clear. Other: An endotracheal tube is partially visualized. An enteric tube is seen extending up into the posterior nasopharynx. The tube kinks in the nasopharynx and extends down into the esophagus. Recommend retraction and repositioning of the enteric tube. CT CERVICAL SPINE FINDINGS Alignment: Normal. Skull base and vertebrae: No acute fracture. No primary bone lesion or focal pathologic process. Soft tissues and spinal canal: No prevertebral fluid or swelling. No visible canal hematoma. Disc levels:  No acute findings. Upper chest: Biapical densities may represent atelectasis/ scarring versus contusion. Other: An endotracheal tube and enteric tube noted. The enteric tube extends and kinks in the posterior nasopharynx under and extends down into the esophagus. Recommend retraction and repositioning. The tips of these tubes are not included in these images.  IMPRESSION: No acute intracranial hemorrhage. No acute/ traumatic cervical spine pathology. Enteric tube extends and kinks in the posterior nasopharynx and extends down into the esophagus. Recommend retraction and repositioning. Electronically Signed   By: Elgie CollardArash  Radparvar M.D.   On: 03/18/2016 22:27   Ct Cervical Spine Wo Contrast  Result Date: 03/18/2016 CLINICAL DATA:  25 year old male with level 1 trauma. EXAM: CT HEAD WITHOUT CONTRAST CT CERVICAL SPINE WITHOUT CONTRAST TECHNIQUE: Multidetector CT imaging of the head and cervical spine was performed following the standard protocol without intravenous contrast. Multiplanar CT image reconstructions of the cervical spine were also generated. COMPARISON:  Head CT dated 04/15/2011 FINDINGS: CT HEAD FINDINGS Brain: No evidence of acute infarction, hemorrhage, hydrocephalus, extra-axial collection or mass lesion/mass effect. Vascular: There is high attenuation of the MCAs bilaterally likely related to hemoconcentration. Skull: Normal. Negative for fracture or focal lesion. Sinuses/Orbits: There is opacification of the nasal  passages as well as partial opacification of the ethmoid air cells. No air-fluid levels. The mastoid air cells are clear. Other: An endotracheal tube is partially visualized. An enteric tube is seen extending up into the posterior nasopharynx. The tube kinks in the nasopharynx and extends down into the esophagus. Recommend retraction and repositioning of the enteric tube. CT CERVICAL SPINE FINDINGS Alignment: Normal. Skull base and vertebrae: No acute fracture. No primary bone lesion or focal pathologic process. Soft tissues and spinal canal: No prevertebral fluid or swelling. No visible canal hematoma. Disc levels:  No acute findings. Upper chest: Biapical densities may represent atelectasis/ scarring versus contusion. Other: An endotracheal tube and enteric tube noted. The enteric tube extends and kinks in the posterior nasopharynx under and  extends down into the esophagus. Recommend retraction and repositioning. The tips of these tubes are not included in these images. IMPRESSION: No acute intracranial hemorrhage. No acute/ traumatic cervical spine pathology. Enteric tube extends and kinks in the posterior nasopharynx and extends down into the esophagus. Recommend retraction and repositioning. Electronically Signed   By: Elgie Collard M.D.   On: 03/18/2016 22:27   Dg Chest Portable 1 View  Result Date: 03/18/2016 CLINICAL DATA:  25 year old male with level 1 trauma. EXAM: PORTABLE CHEST 1 VIEW COMPARISON:  Chest radiograph dated 07/08/2015 FINDINGS: Endotracheal tube with tip at the level of the carina tilting towards the right mainstem bronchus. Recommend retraction by approximately 5 cm. Enteric tube extends into the abdomen with tip and side-port in the left upper abdomen. The lungs are clear. There is no pleural effusion or pneumothorax. Mild cardiomegaly. No acute osseous pathology. IMPRESSION: Endotracheal tube the tip at the level of the carina. Recommend retraction by approximately 5 cm for optimal positioning. Enteric tube with tip and side-port in the left upper abdomen. No acute cardiopulmonary process. These results were called by telephone at the time of interpretation on 03/18/2016 at 10:38 pm to Dr. Madolyn Frieze , who verbally acknowledged these results. Electronically Signed   By: Elgie Collard M.D.   On: 03/18/2016 22:39   Dg Abd Portable 1 View  Result Date: 03/18/2016 CLINICAL DATA:  25 year old male with level 1 trauma. EXAM: PORTABLE CHEST 1 VIEW COMPARISON:  Chest radiograph dated 07/08/2015 FINDINGS: Endotracheal tube with tip at the level of the carina tilting towards the right mainstem bronchus. Recommend retraction by approximately 5 cm. Enteric tube extends into the abdomen with tip and side-port in the left upper abdomen. The lungs are clear. There is no pleural effusion or pneumothorax. Mild cardiomegaly. No  acute osseous pathology. IMPRESSION: Endotracheal tube the tip at the level of the carina. Recommend retraction by approximately 5 cm for optimal positioning. Enteric tube with tip and side-port in the left upper abdomen. No acute cardiopulmonary process. These results were called by telephone at the time of interpretation on 03/18/2016 at 10:38 pm to Dr. Madolyn Frieze , who verbally acknowledged these results. Electronically Signed   By: Elgie Collard M.D.   On: 03/18/2016 22:39    Procedures Date/Time: 03/18/2016 10:59 PM Performed by: Madolyn Frieze Pre-anesthesia Checklist: Patient identified, Emergency Drugs available and Suction available Oxygen Delivery Method: Simple face mask Intubation Type: Rapid sequence Ventilation: Mask ventilation with difficulty Laryngoscope Size: 4 Tube size: 8.0 mm Number of attempts: 1 Airway Equipment and Method: Rigid stylet and Video-laryngoscopy Placement Confirmation: ETT inserted through vocal cords under direct vision Secured at: 24 cm Tube secured with: ETT holder      (including critical care  time)  Medications Ordered in ED Medications  etomidate (AMIDATE) injection (30 mg Intravenous Given 03/18/16 2042)  succinylcholine (ANECTINE) injection (100 mg Intravenous Given 03/18/16 2043)  ondansetron (ZOFRAN) injection 4 mg (4 mg Intravenous Given 03/18/16 2046)  propofol (DIPRIVAN) 1000 MG/100ML infusion (10 mcg/kg/min  136.1 kg Intravenous New Bag/Given 03/18/16 2052)  midazolam (VERSED) injection 5 mg (5 mg Intravenous Given 03/18/16 2055)  0.9 %  sodium chloride infusion ( Intravenous Stopped 03/18/16 2308)     Initial Impression / Assessment and Plan / ED Course  I have reviewed the triage vital signs and the nursing notes.  Pertinent labs & imaging results that were available during my care of the patient were reviewed by me and considered in my medical decision making (see chart for details).  Clinical Course     22 M  presents with altered mental status. Reported to have been drinking and had a fall in the parking lot. Arrives as level 1 trauma. Pt is very agitated and incoherent. He has had several episodes of vomiting and there is concern about aspiration as well as safety of patient and staff. Made decision to intubate. Patient tolerated this well. Propofol given for sedation. CT head and C spine obtained. No further CT imaging due to lack of evidence of trauma.   CT head negative. CT C spine negative. EtOH 160. EtOH level less than what we would expect for his level of agitation. UDS added on. Later spoke with significant other who reports he consumed approximately 18 cans of beer today. She denies any knowledge of drug use.   Discussed with critical care team, they will see the patient for admission given the fact that he is on the ventilator.   Patient is stable at this time on the vent.   Final Clinical Impressions(s) / ED Diagnoses   Final diagnoses:  Encounter for orogastric (OG) tube placement    New Prescriptions New Prescriptions   No medications on file     Madolyn Frieze, MD 03/19/16 0001    Dione Booze, MD 03/20/16 [phone]

## 2016-03-18 NOTE — ED Notes (Signed)
OG repositioned, repeat portable xray ordered

## 2016-03-18 NOTE — Progress Notes (Signed)
Withdrew patient's ETT tube 4cm per verbal MD order. Found patient's ETT at 28 at the lip and it is now 24 at the lip. RT will continue to monitor.

## 2016-03-19 ENCOUNTER — Inpatient Hospital Stay (HOSPITAL_COMMUNITY): Payer: Self-pay

## 2016-03-19 DIAGNOSIS — T17908A Unspecified foreign body in respiratory tract, part unspecified causing other injury, initial encounter: Secondary | ICD-10-CM

## 2016-03-19 DIAGNOSIS — F10921 Alcohol use, unspecified with intoxication delirium: Secondary | ICD-10-CM

## 2016-03-19 DIAGNOSIS — Z4659 Encounter for fitting and adjustment of other gastrointestinal appliance and device: Secondary | ICD-10-CM

## 2016-03-19 DIAGNOSIS — J9601 Acute respiratory failure with hypoxia: Secondary | ICD-10-CM | POA: Diagnosis present

## 2016-03-19 DIAGNOSIS — R221 Localized swelling, mass and lump, neck: Secondary | ICD-10-CM

## 2016-03-19 LAB — RAPID URINE DRUG SCREEN, HOSP PERFORMED
AMPHETAMINES: NOT DETECTED
Barbiturates: NOT DETECTED
Benzodiazepines: POSITIVE — AB
COCAINE: NOT DETECTED
OPIATES: NOT DETECTED
TETRAHYDROCANNABINOL: NOT DETECTED

## 2016-03-19 LAB — BASIC METABOLIC PANEL
Anion gap: 13 (ref 5–15)
BUN: 6 mg/dL (ref 6–20)
CHLORIDE: 104 mmol/L (ref 101–111)
CO2: 21 mmol/L — AB (ref 22–32)
Calcium: 9.4 mg/dL (ref 8.9–10.3)
Creatinine, Ser: 0.86 mg/dL (ref 0.61–1.24)
GFR calc non Af Amer: >60 mL/min (ref >60)
Glucose, Bld: 84 mg/dL (ref 65–99)
POTASSIUM: 3.5 mmol/L (ref 3.5–5.1)
SODIUM: 138 mmol/L (ref 135–145)

## 2016-03-19 LAB — PREPARE FRESH FROZEN PLASMA
Blood Product Expiration Date: 201801032359
Blood Product Expiration Date: 201801032359
ISSUE DATE / TIME: 201712172035
ISSUE DATE / TIME: 201712172035
Unit Type and Rh: 6200
Unit Type and Rh: 6200

## 2016-03-19 LAB — CBC
HEMATOCRIT: 45.3 % (ref 39.0–52.0)
HEMOGLOBIN: 15.3 g/dL (ref 13.0–17.0)
MCH: 28.1 pg (ref 26.0–34.0)
MCHC: 33.8 g/dL (ref 30.0–36.0)
MCV: 83.1 fL (ref 78.0–100.0)
Platelets: 248 10*3/uL (ref 150–400)
RBC: 5.45 MIL/uL (ref 4.22–5.81)
RDW: 14.6 % (ref 11.5–15.5)
WBC: 6 10*3/uL (ref 4.0–10.5)

## 2016-03-19 LAB — HIV ANTIBODY (ROUTINE TESTING W REFLEX): HIV SCREEN 4TH GENERATION: NONREACTIVE

## 2016-03-19 LAB — TYPE AND SCREEN
Blood Product Expiration Date: 201801162359
Blood Product Expiration Date: 201801162359
ISSUE DATE / TIME: 201712172035
ISSUE DATE / TIME: 201712172035
UNIT TYPE AND RH: 9500
Unit Type and Rh: 9500

## 2016-03-19 LAB — GLUCOSE, CAPILLARY
GLUCOSE-CAPILLARY: 68 mg/dL (ref 65–99)
Glucose-Capillary: 92 mg/dL (ref 65–99)

## 2016-03-19 LAB — BLOOD PRODUCT ORDER (VERBAL) VERIFICATION

## 2016-03-19 LAB — PHOSPHORUS: PHOSPHORUS: 3 mg/dL (ref 2.5–4.6)

## 2016-03-19 LAB — TSH: TSH: 0.338 u[IU]/mL — AB (ref 0.350–4.500)

## 2016-03-19 LAB — ABO/RH: ABO/RH(D): O POS

## 2016-03-19 LAB — CDS SEROLOGY

## 2016-03-19 LAB — MAGNESIUM: MAGNESIUM: 2.3 mg/dL (ref 1.7–2.4)

## 2016-03-19 MED ORDER — ENOXAPARIN SODIUM 40 MG/0.4ML ~~LOC~~ SOLN: 40.0000 mg | SUBCUTANEOUS | Status: DC

## 2016-03-19 MED ORDER — CLONAZEPAM 0.5 MG PO TABS: 0.5000 mg | ORAL_TABLET | Freq: Two times a day (BID) | ORAL | Status: DC | PRN

## 2016-03-19 MED ORDER — SODIUM CHLORIDE 0.9 % IV SOLN: 250.0000 mL | INTRAVENOUS | Status: DC | PRN

## 2016-03-19 MED ORDER — ORAL CARE MOUTH RINSE
15.0000 mL | Freq: Two times a day (BID) | OROMUCOSAL | Status: DC
  Administered 2016-03-19 – 2016-03-20 (×2): 15 mL via OROMUCOSAL

## 2016-03-19 MED ORDER — ORAL CARE MOUTH RINSE
15.0000 mL | Freq: Four times a day (QID) | OROMUCOSAL | Status: DC
  Administered 2016-03-19 (×2): 15 mL via OROMUCOSAL

## 2016-03-19 MED ORDER — POTASSIUM CHLORIDE CRYS ER 20 MEQ PO TBCR
20.0000 meq | EXTENDED_RELEASE_TABLET | Freq: Once | ORAL | Status: AC
  Administered 2016-03-19: 20 meq via ORAL
  Filled 2016-03-19: qty 1

## 2016-03-19 MED ORDER — PROPOFOL 1000 MG/100ML IV EMUL
5.0000 ug/kg/min | INTRAVENOUS | Status: DC
  Administered 2016-03-19: 40 ug/kg/min via INTRAVENOUS
  Administered 2016-03-19: 60 ug/kg/min via INTRAVENOUS
  Administered 2016-03-19: 35 ug/kg/min via INTRAVENOUS
  Filled 2016-03-19 (×2): qty 100

## 2016-03-19 MED ORDER — FENTANYL CITRATE (PF) 100 MCG/2ML IJ SOLN: 100.0000 ug | INTRAMUSCULAR | Status: DC | PRN

## 2016-03-19 MED ORDER — ENOXAPARIN SODIUM 30 MG/0.3ML ~~LOC~~ SOLN
30.0000 mg | SUBCUTANEOUS | Status: DC
  Administered 2016-03-19: 30 mg via SUBCUTANEOUS
  Filled 2016-03-19: qty 0.3

## 2016-03-19 MED ORDER — ONDANSETRON HCL 4 MG/2ML IJ SOLN
4.0000 mg | Freq: Four times a day (QID) | INTRAMUSCULAR | Status: DC | PRN
  Administered 2016-03-19: 4 mg via INTRAVENOUS
  Filled 2016-03-19: qty 2

## 2016-03-19 MED ORDER — ACETAMINOPHEN 325 MG PO TABS
650.0000 mg | ORAL_TABLET | Freq: Once | ORAL | Status: AC
  Administered 2016-03-19: 650 mg via ORAL
  Filled 2016-03-19: qty 2

## 2016-03-19 MED ORDER — FOLIC ACID 1 MG PO TABS
1.0000 mg | ORAL_TABLET | Freq: Every day | ORAL | Status: DC
  Administered 2016-03-19 – 2016-03-20 (×2): 1 mg via ORAL
  Filled 2016-03-19 (×2): qty 1

## 2016-03-19 MED ORDER — CHLORHEXIDINE GLUCONATE 0.12% ORAL RINSE (MEDLINE KIT)
15.0000 mL | Freq: Two times a day (BID) | OROMUCOSAL | Status: DC
  Administered 2016-03-19: 15 mL via OROMUCOSAL

## 2016-03-19 MED ORDER — DEXMEDETOMIDINE HCL IN NACL 200 MCG/50ML IV SOLN
0.4000 ug/kg/h | INTRAVENOUS | Status: DC
  Administered 2016-03-19 (×2): 0.4 ug/kg/h via INTRAVENOUS
  Administered 2016-03-19: 0.8 ug/kg/h via INTRAVENOUS
  Administered 2016-03-19 (×2): 1 ug/kg/h via INTRAVENOUS
  Filled 2016-03-19 (×5): qty 50

## 2016-03-19 MED ORDER — ACETAMINOPHEN 325 MG PO TABS
650.0000 mg | ORAL_TABLET | Freq: Four times a day (QID) | ORAL | Status: DC | PRN
  Administered 2016-03-19 – 2016-03-20 (×2): 650 mg via ORAL
  Filled 2016-03-19 (×2): qty 2

## 2016-03-19 MED ORDER — VITAMIN B-1 100 MG PO TABS
100.0000 mg | ORAL_TABLET | Freq: Every day | ORAL | Status: DC
  Administered 2016-03-19 – 2016-03-20 (×2): 100 mg via ORAL
  Filled 2016-03-19 (×2): qty 1

## 2016-03-19 MED ORDER — FAMOTIDINE IN NACL 20-0.9 MG/50ML-% IV SOLN
20.0000 mg | Freq: Two times a day (BID) | INTRAVENOUS | Status: DC
  Administered 2016-03-19 – 2016-03-20 (×3): 20 mg via INTRAVENOUS
  Filled 2016-03-19 (×4): qty 50

## 2016-03-19 MED ORDER — PROPOFOL 1000 MG/100ML IV EMUL
INTRAVENOUS | Status: AC
  Filled 2016-03-19: qty 100

## 2016-03-19 NOTE — Procedures (Signed)
Extubation Procedure Note  Patient Details:   Name: Jason Collier DOB: 12-27-1990 MRN: [id number]   Airway Documentation:     Evaluation  O2 sats: stable throughout Complications: No apparent complications Patient did tolerate procedure well. Bilateral Breath Sounds: Clear   Yes   Pt. Was originally extubated to a 4L [place] then placed on a 55% venti mask due to mouth breathing. Pt. Isn't in any distress at this time, there is no stridor noted. Pt. Was instructed on IS x 5, highest goal achieved was 1500mL.  Gerasimos Plotts, Margaretmary Dysshley L 03/19/2016, 11:45 AM
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
## 2016-03-19 NOTE — Progress Notes (Signed)
PT TRANSFERRED FROM 35M, C/O HEADACHE AND NAUSEA. DR WAS CALLED AND NEW ORDERS WERE PLACED.

## 2016-03-19 NOTE — H&P (Signed)
PULMONARY / CRITICAL CARE MEDICINE   Name: Jason Collier MRN: [id number] DOB: Feb 21, 1991    ADMISSION DATE:  03/18/2016 CONSULTATION DATE:    REFERRING MD:  EDP  CHIEF COMPLAINT:  Altered mental status / acute EtOH intoxication  HISTORY OF PRESENT ILLNESS:   Jason Collier is a 25M with PMH significant for anxiety and prior knee surgery, who presents via EMS after being found down outside a bar. He reportedly consumed 18 beers today. On arrival to the ED he was maintaining his airway, but had clearly been vomiting. Out of concern for aspiration and safety of the staff, he was intubated. Labs were notable for lactate 2.92, EtOH level 178, mild AST/ALT elevations.   His fiancee reports that he has struggled with alcohol in the past, but not so much over the last few months, drinking only socially. He was at a [employer] for a friend today and just kept drinking. He had at least 18 beers and liquor. She is unaware of any other illicit drug use.   PAST MEDICAL HISTORY :  He  has no past medical history on file.  Anxiety  PAST SURGICAL HISTORY: He  has no past surgical history on file.  Knee arthroscopy  Allergies  Allergen Reactions  . Hydrocodone Nausea And Vomiting  . Tramadol Itching    No current facility-administered medications on file prior to encounter.    No current outpatient prescriptions on file prior to encounter.    FAMILY HISTORY:  His has no family status information on file.    SOCIAL HISTORY: He  is reportedly a smoker and also smokes occasional marijuana.   REVIEW OF SYSTEMS:   Unable to obtain 2/2 intubated state.   SUBJECTIVE:    VITAL SIGNS: BP 127/85   Pulse 70   Resp 16   Ht 6\' 2"  (1.88 m)   Wt 136.1 kg (300 lb)   SpO2 100%   BMI 38.52 kg/m   HEMODYNAMICS:    VENTILATOR SETTINGS: Vent Mode: PRVC FiO2 (%):  [40 %-60 %] 60 % Set Rate:  [16 bmp] 16 bmp Vt Set:  [620 mL-650 mL] 650 mL PEEP:  [5 cmH20] 5 cmH20 Plateau  Pressure:  [24 cmH20] 24 cmH20  INTAKE / OUTPUT: No intake/output data recorded.  PHYSICAL EXAMINATION: Physical Exam:  General Well nourished, well developed, no apparent distress  HEENT No gross abnormalities. OETT / OGT in place  Pulmonary Clear to auscultation bilaterally with no wheezes, rales or ronchi. Vent-assisted effort, symmetrical expansion.   Cardiovascular Normal rate, regular rhythm. S1, s2. No m/r/g. Distal pulses palpable.  Abdomen Soft, non-tender, non-distended, positive bowel sounds, no palpable organomegaly or masses. Normoresonant to percussion.  Musculoskeletal Grossly normal  Lymphatics No cervical, supraclavicular or axillary adenopathy.   Neurologic Grossly intact. No focal deficits.   Skin/Integuement No rash, no cyanosis, no clubbing.     LABS:  BMET  Recent Labs Lab 03/18/16 2056 03/18/16 2058  NA 134* 138  K 3.9 6.0*  CL 102 106  CO2 19*  --   BUN 8 11  CREATININE 1.06 1.30*  GLUCOSE 94 96    Electrolytes  Recent Labs Lab 03/18/16 2056  CALCIUM 9.1    CBC  Recent Labs Lab 03/18/16 2056 03/18/16 2058  WBC 10.4  --   HGB 15.5 16.3  HCT 45.1 48.0  PLT 239  --     Coag's  Recent Labs Lab 03/18/16 2056  INR 0.94    Sepsis Markers  Recent Labs  Lab 03/18/16 2059  LATICACIDVEN 2.92*    ABG  Recent Labs Lab 03/18/16 2131  PHART 7.306*  PCO2ART 43.0  PO2ART 67.0*    Liver Enzymes  Recent Labs Lab 03/18/16 2056  AST 54*  ALT 85*  ALKPHOS 59  BILITOT 0.5  ALBUMIN 4.2    Cardiac Enzymes No results for input(s): TROPONINI, PROBNP in the last 168 hours.  Glucose No results for input(s): GLUCAP in the last 168 hours.  Imaging Ct Head Wo Contrast  Result Date: 03/18/2016 CLINICAL DATA:  25 year old male with level 1 trauma. EXAM: CT HEAD WITHOUT CONTRAST CT CERVICAL SPINE WITHOUT CONTRAST TECHNIQUE: Multidetector CT imaging of the head and cervical spine was performed following the standard protocol  without intravenous contrast. Multiplanar CT image reconstructions of the cervical spine were also generated. COMPARISON:  Head CT dated 04/15/2011 FINDINGS: CT HEAD FINDINGS Brain: No evidence of acute infarction, hemorrhage, hydrocephalus, extra-axial collection or mass lesion/mass effect. Vascular: There is high attenuation of the MCAs bilaterally likely related to hemoconcentration. Skull: Normal. Negative for fracture or focal lesion. Sinuses/Orbits: There is opacification of the nasal passages as well as partial opacification of the ethmoid air cells. No air-fluid levels. The mastoid air cells are clear. Other: An endotracheal tube is partially visualized. An enteric tube is seen extending up into the posterior nasopharynx. The tube kinks in the nasopharynx and extends down into the esophagus. Recommend retraction and repositioning of the enteric tube. CT CERVICAL SPINE FINDINGS Alignment: Normal. Skull base and vertebrae: No acute fracture. No primary bone lesion or focal pathologic process. Soft tissues and spinal canal: No prevertebral fluid or swelling. No visible canal hematoma. Disc levels:  No acute findings. Upper chest: Biapical densities may represent atelectasis/ scarring versus contusion. Other: An endotracheal tube and enteric tube noted. The enteric tube extends and kinks in the posterior nasopharynx under and extends down into the esophagus. Recommend retraction and repositioning. The tips of these tubes are not included in these images. IMPRESSION: No acute intracranial hemorrhage. No acute/ traumatic cervical spine pathology. Enteric tube extends and kinks in the posterior nasopharynx and extends down into the esophagus. Recommend retraction and repositioning. Electronically Signed   By: Elgie CollardArash  Radparvar M.D.   On: 03/18/2016 22:27   Ct Cervical Spine Wo Contrast  Result Date: 03/18/2016 CLINICAL DATA:  25 year old male with level 1 trauma. EXAM: CT HEAD WITHOUT CONTRAST CT CERVICAL SPINE  WITHOUT CONTRAST TECHNIQUE: Multidetector CT imaging of the head and cervical spine was performed following the standard protocol without intravenous contrast. Multiplanar CT image reconstructions of the cervical spine were also generated. COMPARISON:  Head CT dated 04/15/2011 FINDINGS: CT HEAD FINDINGS Brain: No evidence of acute infarction, hemorrhage, hydrocephalus, extra-axial collection or mass lesion/mass effect. Vascular: There is high attenuation of the MCAs bilaterally likely related to hemoconcentration. Skull: Normal. Negative for fracture or focal lesion. Sinuses/Orbits: There is opacification of the nasal passages as well as partial opacification of the ethmoid air cells. No air-fluid levels. The mastoid air cells are clear. Other: An endotracheal tube is partially visualized. An enteric tube is seen extending up into the posterior nasopharynx. The tube kinks in the nasopharynx and extends down into the esophagus. Recommend retraction and repositioning of the enteric tube. CT CERVICAL SPINE FINDINGS Alignment: Normal. Skull base and vertebrae: No acute fracture. No primary bone lesion or focal pathologic process. Soft tissues and spinal canal: No prevertebral fluid or swelling. No visible canal hematoma. Disc levels:  No acute findings. Upper chest: Biapical densities  may represent atelectasis/ scarring versus contusion. Other: An endotracheal tube and enteric tube noted. The enteric tube extends and kinks in the posterior nasopharynx under and extends down into the esophagus. Recommend retraction and repositioning. The tips of these tubes are not included in these images. IMPRESSION: No acute intracranial hemorrhage. No acute/ traumatic cervical spine pathology. Enteric tube extends and kinks in the posterior nasopharynx and extends down into the esophagus. Recommend retraction and repositioning. Electronically Signed   By: Elgie Collard M.D.   On: 03/18/2016 22:27   Dg Chest Portable 1  View  Result Date: 03/18/2016 CLINICAL DATA:  25 year old male with level 1 trauma. EXAM: PORTABLE CHEST 1 VIEW COMPARISON:  Chest radiograph dated 07/08/2015 FINDINGS: Endotracheal tube with tip at the level of the carina tilting towards the right mainstem bronchus. Recommend retraction by approximately 5 cm. Enteric tube extends into the abdomen with tip and side-port in the left upper abdomen. The lungs are clear. There is no pleural effusion or pneumothorax. Mild cardiomegaly. No acute osseous pathology. IMPRESSION: Endotracheal tube the tip at the level of the carina. Recommend retraction by approximately 5 cm for optimal positioning. Enteric tube with tip and side-port in the left upper abdomen. No acute cardiopulmonary process. These results were called by telephone at the time of interpretation on 03/18/2016 at 10:38 pm to Dr. Madolyn Frieze , who verbally acknowledged these results. Electronically Signed   By: Elgie Collard M.D.   On: 03/18/2016 22:39   Dg Abd Portable 1 View  Result Date: 03/18/2016 CLINICAL DATA:  25 year old male with level 1 trauma. EXAM: PORTABLE CHEST 1 VIEW COMPARISON:  Chest radiograph dated 07/08/2015 FINDINGS: Endotracheal tube with tip at the level of the carina tilting towards the right mainstem bronchus. Recommend retraction by approximately 5 cm. Enteric tube extends into the abdomen with tip and side-port in the left upper abdomen. The lungs are clear. There is no pleural effusion or pneumothorax. Mild cardiomegaly. No acute osseous pathology. IMPRESSION: Endotracheal tube the tip at the level of the carina. Recommend retraction by approximately 5 cm for optimal positioning. Enteric tube with tip and side-port in the left upper abdomen. No acute cardiopulmonary process. These results were called by telephone at the time of interpretation on 03/18/2016 at 10:38 pm to Dr. Madolyn Frieze , who verbally acknowledged these results. Electronically Signed   By: Elgie Collard M.D.   On: 03/18/2016 22:39   STUDIES:    CULTURES: none  ANTIBIOTICS: none  SIGNIFICANT EVENTS:   LINES/TUBES: OETT OGT PIV  DISCUSSION: Mr. Degidio is a 37M presenting acutely intoxicated and requiring ventilatory support to protect his airway. His labs do not suggest any other explanation for his altered mental status. He may have aspirated, which likely explains his low pO2, but no indication for antibiotics at this time. Will transition propofol to precedex to facilitate vent weaning.    ASSESSMENT / PLAN:  PULMONARY A: Acute hypoxemic respiratory failure Need for mechanical ventilation P:   Continue ventilatory support Wean as tolerated  SBT when able  CARDIOVASCULAR A:  No acute issues P:    RENAL A:   Lactic/metabolic acidosis P:   Fluid resuscitate Trend BMP Trend lactate  GASTROINTESTINAL A:   Vomiting - likely 2/2 acute intoxication P:  NPO  HEMATOLOGIC A:   No acute issues P:  Trend CBC  INFECTIOUS A:   No acute issues Likely aspiration P:   Monitor No antibiotics for now  ENDOCRINE A:   No acute issues  P:   Monitor  NEUROLOGIC A:   Acute toxic/metabolic encephalopathy 2/2 acute EtOH intoxication No evidence of head trauma on CT P:   RASS goal: 0 Transition propofol to precedex CIWA when appropriate Clear C-collar when able  FAMILY  - Updates: Fiancee at bedside. His mother has also been updated on his condition by other family members. He is a FULL CODE.   - Inter-disciplinary family meet or Palliative Care meeting due by:  day 7  The patient is critically ill with multiple organ system failure and requires high complexity decision making for assessment and support, frequent evaluation and titration of therapies, advanced monitoring, review of radiographic studies and interpretation of complex data.   Critical Care Time devoted to patient care services, exclusive of separately billable procedures,  described in this note is 38 minutes.   Nita SickleSarah Ellen E. Tassie Pollett, MD Pulmonary and Critical Care Medicine [facility] Pager: [phone]  03/19/2016, 12:12 AM

## 2016-03-19 NOTE — Progress Notes (Signed)
   03/19/16 1000  Clinical Encounter Type  Visited With Patient and family together  Visit Type Follow-up  Referral From Chaplain  Spiritual Encounters  Spiritual Needs Emotional  Stress Factors  Patient Stress Factors Not reviewed  Family Stress Factors Family relationships  Introduction to family as Pt unresponsive. No services needed at this time.

## 2016-03-20 ENCOUNTER — Inpatient Hospital Stay (HOSPITAL_COMMUNITY): Payer: Self-pay

## 2016-03-20 DIAGNOSIS — J9601 Acute respiratory failure with hypoxia: Secondary | ICD-10-CM

## 2016-03-20 LAB — HEPATITIS PANEL, ACUTE
HCV Ab: 0.1 s/co ratio (ref 0.0–0.9)
HEP B C IGM: NEGATIVE
Hep A IgM: NEGATIVE
Hepatitis B Surface Ag: NEGATIVE

## 2016-03-20 LAB — BASIC METABOLIC PANEL
ANION GAP: 6 (ref 5–15)
BUN: 9 mg/dL (ref 6–20)
CHLORIDE: 103 mmol/L (ref 101–111)
CO2: 29 mmol/L (ref 22–32)
Calcium: 9.3 mg/dL (ref 8.9–10.3)
Creatinine, Ser: 1.19 mg/dL (ref 0.61–1.24)
GFR calc Af Amer: >60 mL/min (ref >60)
Glucose, Bld: 101 mg/dL — ABNORMAL HIGH (ref 65–99)
POTASSIUM: 3.7 mmol/L (ref 3.5–5.1)
SODIUM: 138 mmol/L (ref 135–145)

## 2016-03-20 MED ORDER — FAMOTIDINE 20 MG PO TABS: 20.0000 mg | ORAL_TABLET | Freq: Two times a day (BID) | ORAL | Status: DC

## 2016-03-20 MED ORDER — IBUPROFEN 600 MG PO TABS
600.0000 mg | ORAL_TABLET | Freq: Four times a day (QID) | ORAL | Status: DC | PRN
  Administered 2016-03-20 (×2): 600 mg via ORAL
  Filled 2016-03-20 (×3): qty 1

## 2016-03-20 NOTE — Progress Notes (Signed)
Pt ambulated with nurse tech on RA. No signs of distress and patient oxygen sats did not drop below 98. Will continue to monitor. Thanks, Jillyn HiddenStone,Charlisa Cham R, RN

## 2016-03-20 NOTE — Discharge Summary (Signed)
Physician Discharge Summary  Patient ID: NEIMAN ROOTS MRN: [id number] DOB/AGE: 05-28-1990 25 y.o.  Admit date: 03/18/2016 Discharge date: 03/20/2016    Discharge Diagnoses:  Acute Hypoxemic Respiratory Failure in setting of ETOH intoxication  Lactic/Metabolic Acidosis secondary to ETOH intoxication Acute toxic/metabolic encephalopathy secondary to ETOH intoxication  Pain related to fall  Anxiety                                                                       DISCHARGE PLAN BY DIAGNOSIS     Acute Hypoxemic Respiratory Failure in setting of ETOH intoxication  Lactic/Metabolic Acidosis secondary to ETOH intoxication Acute toxic/metabolic encephalopathy secondary to ETOH intoxication  Discharge Plan: -Has resolved inpatient -Spoke with patient and wife about ETOH consumption and resources available  -Will Follow up with Primary care physician on Thursday    Pain related to fall  -Continue Ibuprofen every 6 hours as needed   Anxiety  Discharge Plan: -Continue home dose Klonopin                DISCHARGE SUMMARY   Jason Collier is a 25 y.o. y/o male with a PMH of anxiety and prior knee surgery. Presents to ED on 12/17 after being found down outside of a bar. It was reported that he consumed 18 beers that day. Celesta Gentile reports the he has struggled with alcohol in the past, however only has drank socially the last few months. Today he was at a [employer] for a friend and just kept drinking. On arrival he was lethargic however maintaining his airway, but had been vomiting. Out of concern for aspiration he was intubated. Labs were notable for lactate 2.92, EtOH level 178, mild AST/ALT elevations. CT of head and C-Spine were clear of any acute processes. Patient was extubated on 03/49 without complications and lactic/metabolic acidosis improved. On 12/18 patient was transferred to medical unit and on 12/19 if was deemed safe for patient to go home. Patient has a follow-up  appointment with his primary care doctor this Thursday.           SIGNIFICANT DIAGNOSTIC STUDIES 12/17 CT Head > No intracranial hemorrhage or acute/traumatic findings  12/17 CT C-Spine > No acute/ traumatic cervical spine pathology 12/18 U/S Neck > No adenopathy, mass, cyst, abscess, or aneurysm  TUBES / LINES ETT 12/17 > 12/18  Discharge Exam: General: Adult male, sitting in bed, no distress  Neuro: Alert, oriented, follows commands  CV: No MRG, RRR, NI S1/S2  PULM: Clear breath sounds, unlabored  GI: Non-tender, non-distended  Extremities: no deformity  Vitals:   03/19/16 1512 03/19/16 1637 03/19/16 2146 03/20/16 0511  BP: 123/85 127/74 134/64 132/70  Pulse: 72 68 66 (!) 52  Resp: 20   18  Temp:  98.1 F (36.7 C) 99.3 F (37.4 C) 98.6 F (37 C)  TempSrc:  Oral Oral Oral  SpO2: 100% 100% 100% 100%  Weight:    120.9 kg (266 lb 8 oz)  Height:         Discharge Labs  BMET  Recent Labs Lab 03/18/16 2056 03/18/16 2058 03/19/16 0235 03/20/16 0502  NA 134* 138 138 138  K 3.9 6.0* 3.5 3.7  CL 102 106 104 103  CO2 19*  --  21* 29  GLUCOSE 94 96 84 101*  BUN _0 CREATININE 1.06 1.30* 0.86 1.19  CALCIUM 9.1  --  9.4 9.3  MG  --   --  2.3  --   PHOS  --   --  3.0  --     CBC  Recent Labs Lab 03/18/16 2056 03/18/16 2058 03/19/16 0235  HGB 15.5 16.3 15.3  HCT 45.1 48.0 45.3  WBC 10.4  --  6.0  PLT 239  --  248    Anti-Coagulation  Recent Labs Lab 03/18/16 2056  INR 0.94       Follow-up Information    [place] on 03/28/2016.   Why:  Post hospital follow up scheduled on 03/28/2016 at 2:30pm with Dr. Gwendolyn Grant Contact information: [place] [address] [phone]           Allergies as of 03/20/2016      Reactions   Hydrocodone Nausea And Vomiting   Tramadol Itching      Medication List    TAKE these medications   clonazePAM 0.5 MG  tablet Commonly known as:  KLONOPIN Take 0.5 mg by mouth 2 (two) times daily as needed for anxiety.   ibuprofen 200 MG tablet Commonly known as:  ADVIL,MOTRIN Take 200-800 mg by mouth every 6 (six) hours as needed for headache.        Disposition: Patient to discharge home with follow up appointment on Thursday with Primary Care Physician.   Discharged Condition: [name] has met maximum benefit of inpatient care and is medically stable and cleared for discharge.  Patient is pending follow up as above.      Time spent on disposition:  Greater than 35 minutes.   Signed: Hayden Pedro, AG-ACNP [facility]  Pgr: [phone]  PCCM Pgr: [phone]

## 2016-03-20 NOTE — Progress Notes (Signed)
PULMONARY / CRITICAL CARE MEDICINE   Name: Jason Collier E Deming MRN: [id number] DOB: 1990/04/10    ADMISSION DATE:  03/18/2016 CONSULTATION DATE:    REFERRING MD:  EDP  CHIEF COMPLAINT:  Altered mental status / acute EtOH intoxication  HISTORY OF PRESENT ILLNESS:   53M with PMH significant for anxiety, ETOH and prior knee surgery, who presents via EMS 12/17 after being found down outside a bar.  On arrival to the ED he was maintaining his airway, but had clearly been vomiting.  Out of concern for aspiration and safety of the staff, he was intubated. Labs were notable for lactate 2.92, EtOH level 178, mild AST/ALT elevations.    SUBJECTIVE:  12/19 - no issues overngiht on monitor per RN. Both RN and patient report ongoing mild headache. Also mild throat pain following tinubation but no dysphagia with meals. No other complaints.   VITAL SIGNS: BP 132/70 (BP Location: Left Arm)   Pulse (!) 52   Temp 98.6 F (37 C) (Oral)   Resp 18   Ht 6\' 2"  (1.88 m)   Wt 120.9 kg (266 lb 8 oz)   SpO2 100%   BMI 34.22 kg/m   HEMODYNAMICS:    INTAKE / OUTPUT: I/O last 3 completed shifts: In: 1552.3 [I.V.:1502.3; IV Piggyback:50] Out: 2775 [Urine:2125; Emesis/NG output:250; Drains:400]  PHYSICAL EXAMINATION: Physical Exam:  General Well nourished, well developed, no apparent distress  HEENT No gross abnormalities. OETT / OGT in place  Pulmonary Clear to auscultation bilaterally with no wheezes, rales or ronchi. Vent-assisted effort, symmetrical expansion.   Cardiovascular Normal rate, regular rhythm. S1, s2. No m/r/g. Distal pulses palpable.  Abdomen Soft, non-tender, non-distended, positive bowel sounds, no palpable organomegaly or masses. Normoresonant to percussion.  Musculoskeletal Grossly normal  Lymphatics No cervical, supraclavicular or axillary adenopathy.   Neurologic Grossly intact. No focal deficits.   Skin/Integuement No rash, no cyanosis, no clubbing.      LABS:  PULMONARY  Recent Labs Lab 03/18/16 2058 03/18/16 2131  PHART  --  7.306*  PCO2ART  --  43.0  PO2ART  --  67.0*  HCO3  --  21.5  TCO2 24 23  O2SAT  --  91.0    CBC  Recent Labs Lab 03/18/16 2056 03/18/16 2058 03/19/16 0235  HGB 15.5 16.3 15.3  HCT 45.1 48.0 45.3  WBC 10.4  --  6.0  PLT 239  --  248    COAGULATION  Recent Labs Lab 03/18/16 2056  INR 0.94    CARDIAC  No results for input(s): TROPONINI in the last 168 hours. No results for input(s): PROBNP in the last 168 hours.   CHEMISTRY  Recent Labs Lab 03/18/16 2056 03/18/16 2058 03/19/16 0235 03/20/16 0502  NA 134* 138 138 138  K 3.9 6.0* 3.5 3.7  CL 102 106 104 103  CO2 19*  --  21* 29  GLUCOSE 94 96 84 101*  BUN 8 11 6 9   CREATININE 1.06 1.30* 0.86 1.19  CALCIUM 9.1  --  9.4 9.3  MG  --   --  2.3  --   PHOS  --   --  3.0  --    Estimated Creatinine Clearance: 133.4 mL/min (by C-G formula based on SCr of 1.19 mg/dL).   LIVER  Recent Labs Lab 03/18/16 2056  AST 54*  ALT 85*  ALKPHOS 59  BILITOT 0.5  PROT 7.1  ALBUMIN 4.2  INR 0.94     INFECTIOUS  Recent Labs Lab 03/18/16 2059  LATICACIDVEN 2.92*     ENDOCRINE CBG (last 3)   Recent Labs  03/19/16 0258 03/19/16 0833  GLUCAP 68 92         IMAGING x48h  - image(s) personally visualized  -   highlighted in bold Dg Chest 2 View  Result Date: 03/20/2016 CLINICAL DATA:  F/U Aspiration into airway. Fall x 3 days ago; vomiting, alcohol abuse, chest pain from vomiting. Smoker @ .5ppd. EXAM: CHEST - 2 VIEW COMPARISON:  03/18/2016 FINDINGS: Patient has been extubated and the nasogastric tube removed. Lungs are clear. Heart size and mediastinal contours are within normal limits. No effusion. Visualized bones unremarkable. IMPRESSION: No acute cardiopulmonary disease post extubation Electronically Signed   By: Corlis Leak  Hassell M.D.   On: 03/20/2016 08:41   Ct Head Wo Contrast  Result Date: 03/18/2016 CLINICAL  DATA:  25 year old male with level 1 trauma. EXAM: CT HEAD WITHOUT CONTRAST CT CERVICAL SPINE WITHOUT CONTRAST TECHNIQUE: Multidetector CT imaging of the head and cervical spine was performed following the standard protocol without intravenous contrast. Multiplanar CT image reconstructions of the cervical spine were also generated. COMPARISON:  Head CT dated 04/15/2011 FINDINGS: CT HEAD FINDINGS Brain: No evidence of acute infarction, hemorrhage, hydrocephalus, extra-axial collection or mass lesion/mass effect. Vascular: There is high attenuation of the MCAs bilaterally likely related to hemoconcentration. Skull: Normal. Negative for fracture or focal lesion. Sinuses/Orbits: There is opacification of the nasal passages as well as partial opacification of the ethmoid air cells. No air-fluid levels. The mastoid air cells are clear. Other: An endotracheal tube is partially visualized. An enteric tube is seen extending up into the posterior nasopharynx. The tube kinks in the nasopharynx and extends down into the esophagus. Recommend retraction and repositioning of the enteric tube. CT CERVICAL SPINE FINDINGS Alignment: Normal. Skull base and vertebrae: No acute fracture. No primary bone lesion or focal pathologic process. Soft tissues and spinal canal: No prevertebral fluid or swelling. No visible canal hematoma. Disc levels:  No acute findings. Upper chest: Biapical densities may represent atelectasis/ scarring versus contusion. Other: An endotracheal tube and enteric tube noted. The enteric tube extends and kinks in the posterior nasopharynx under and extends down into the esophagus. Recommend retraction and repositioning. The tips of these tubes are not included in these images. IMPRESSION: No acute intracranial hemorrhage. No acute/ traumatic cervical spine pathology. Enteric tube extends and kinks in the posterior nasopharynx and extends down into the esophagus. Recommend retraction and repositioning.  Electronically Signed   By: Elgie CollardArash  Radparvar M.D.   On: 03/18/2016 22:27   Ct Cervical Spine Wo Contrast  Result Date: 03/18/2016 CLINICAL DATA:  25 year old male with level 1 trauma. EXAM: CT HEAD WITHOUT CONTRAST CT CERVICAL SPINE WITHOUT CONTRAST TECHNIQUE: Multidetector CT imaging of the head and cervical spine was performed following the standard protocol without intravenous contrast. Multiplanar CT image reconstructions of the cervical spine were also generated. COMPARISON:  Head CT dated 04/15/2011 FINDINGS: CT HEAD FINDINGS Brain: No evidence of acute infarction, hemorrhage, hydrocephalus, extra-axial collection or mass lesion/mass effect. Vascular: There is high attenuation of the MCAs bilaterally likely related to hemoconcentration. Skull: Normal. Negative for fracture or focal lesion. Sinuses/Orbits: There is opacification of the nasal passages as well as partial opacification of the ethmoid air cells. No air-fluid levels. The mastoid air cells are clear. Other: An endotracheal tube is partially visualized. An enteric tube is seen extending up into the posterior nasopharynx. The tube kinks in the nasopharynx and extends down into the esophagus. Recommend  retraction and repositioning of the enteric tube. CT CERVICAL SPINE FINDINGS Alignment: Normal. Skull base and vertebrae: No acute fracture. No primary bone lesion or focal pathologic process. Soft tissues and spinal canal: No prevertebral fluid or swelling. No visible canal hematoma. Disc levels:  No acute findings. Upper chest: Biapical densities may represent atelectasis/ scarring versus contusion. Other: An endotracheal tube and enteric tube noted. The enteric tube extends and kinks in the posterior nasopharynx under and extends down into the esophagus. Recommend retraction and repositioning. The tips of these tubes are not included in these images. IMPRESSION: No acute intracranial hemorrhage. No acute/ traumatic cervical spine pathology.  Enteric tube extends and kinks in the posterior nasopharynx and extends down into the esophagus. Recommend retraction and repositioning. Electronically Signed   By: Elgie Collard M.D.   On: 03/18/2016 22:27   Dg Chest Portable 1 View  Result Date: 03/18/2016 CLINICAL DATA:  25 year old male with level 1 trauma. EXAM: PORTABLE CHEST 1 VIEW COMPARISON:  Chest radiograph dated 07/08/2015 FINDINGS: Endotracheal tube with tip at the level of the carina tilting towards the right mainstem bronchus. Recommend retraction by approximately 5 cm. Enteric tube extends into the abdomen with tip and side-port in the left upper abdomen. The lungs are clear. There is no pleural effusion or pneumothorax. Mild cardiomegaly. No acute osseous pathology. IMPRESSION: Endotracheal tube the tip at the level of the carina. Recommend retraction by approximately 5 cm for optimal positioning. Enteric tube with tip and side-port in the left upper abdomen. No acute cardiopulmonary process. These results were called by telephone at the time of interpretation on 03/18/2016 at 10:38 pm to Dr. Madolyn Frieze , who verbally acknowledged these results. Electronically Signed   By: Elgie Collard M.D.   On: 03/18/2016 22:39   Dg Abd Portable 1v  Result Date: 03/19/2016 CLINICAL DATA:  25 year old male with enteric tube placement. EXAM: PORTABLE ABDOMEN - 1 VIEW COMPARISON:  Abdominal radiograph dated 03/18/2016 FINDINGS: An enteric tube is partially visualized with tip and side-port in the left upper abdomen likely in the proximal stomach. There is no bowel dilatation or evidence of obstruction. Left lung base densities may represent atelectasis versus infiltrate. The osseous structures and soft tissues are grossly unremarkable. IMPRESSION: Enteric tube the tip in the left upper abdomen. No bowel dilatation. Left lung base atelectasis versus infiltrate. Electronically Signed   By: Elgie Collard M.D.   On: 03/19/2016 00:40   Dg Abd  Portable 1 View  Result Date: 03/18/2016 CLINICAL DATA:  25 year old male with level 1 trauma. EXAM: PORTABLE CHEST 1 VIEW COMPARISON:  Chest radiograph dated 07/08/2015 FINDINGS: Endotracheal tube with tip at the level of the carina tilting towards the right mainstem bronchus. Recommend retraction by approximately 5 cm. Enteric tube extends into the abdomen with tip and side-port in the left upper abdomen. The lungs are clear. There is no pleural effusion or pneumothorax. Mild cardiomegaly. No acute osseous pathology. IMPRESSION: Endotracheal tube the tip at the level of the carina. Recommend retraction by approximately 5 cm for optimal positioning. Enteric tube with tip and side-port in the left upper abdomen. No acute cardiopulmonary process. These results were called by telephone at the time of interpretation on 03/18/2016 at 10:38 pm to Dr. Madolyn Frieze , who verbally acknowledged these results. Electronically Signed   By: Elgie Collard M.D.   On: 03/18/2016 22:39   US Soft Tissue Neck  Result Date: 03/20/2016 CLINICAL DATA:  Right neck fullness EXAM: ULTRASOUND OF HEAD/NECK SOFT TISSUES  TECHNIQUE: Ultrasound examination of the head and neck soft tissues was performed in the area of clinical concern. COMPARISON:  CT 03/18/2016 FINDINGS: No adenopathy, mass, cyst, abscess, aneurysm, or other pathologic correlate in the region of clinical concern. Limited contralateral images are symmetric and unremarkable. IMPRESSION: No ultrasound abnormality Electronically Signed   By: Corlis Leak M.D.   On: 03/20/2016 08:41      STUDIES:  CT head/c-spine 12/17>>> No acute intracranial hemorrhage.  No acute/ traumatic cervical spine pathology.  CULTURES: none  ANTIBIOTICS: none  SIGNIFICANT EVENTS:   LINES/TUBES: OETT 12/17>>>12/18 OGT PIV  DISCUSSION: Mr. Bracewell is a 66M presenting acutely intoxicated and requiring ventilatory support to protect his airway. His labs do not suggest any other  explanation for his altered mental status. He may have aspirated, which likely explains his low pO2, but no indication for antibiotics at this time. Will transition propofol to precedex to facilitate vent weaning.    ASSESSMENT / PLAN:  PULMONARY A: Acute hypoxemic respiratory failure r/t AMS in setting acute ETOH intoxication  ?aspiration    -no resp issues P:   Walk test on room air OPD fu cxr with pcp in 4 weeks to ensure atelectasis cleared  CARDIOVASCULAR A:  No acute issues P:  Dc telement   RENAL A:   Lactic/metabolic acidosis -resolved P:   F/u chem   GASTROINTESTINAL A:   Vomiting - likely 2/2 acute intoxication  - no vomit P:  Advance diet as tolerated post extubation   HEMATOLOGIC A:   No acute issues P:  Trend CBC  INFECTIOUS A:   No acute issues Likely aspiration P:   Monitor No antibiotics for now  ENDOCRINE A:   No acute issues . Normal US neck TSH slight low [id number] could be ICU illness P:   Monitor opd TSH with PCP  recommended  NEUROLOGIC A:   Acute toxic/metabolic encephalopathy 2/2 acute EtOH intoxication No evidence of head/neck trauma on CT   - mild headache +. Non focal neuro. Possibly etoh withdrawal +. BAseline anxiety +. OFf c-spine without tenderness P:   monitor SW consult for ETOH  Klonopin home dose  FAMILY  - Updates: mom, grandmother and Steffanie Rainwater updated at bedside 12/18 and GF/patient on 03/20/16  - Inter-disciplinary family meet or Palliative Care meeting due by:  day 7    DC home after walk test 03/20/2016 > dc planning  Dr. Kalman Shan, M.D., [facility].C.P Pulmonary and Critical Care Medicine Staff Physician [facility] [place] [facility] Pager: [phone], If no answer or between  15:00h - 7:00h: call [phone]  03/20/2016 8:55 AM

## 2016-03-28 ENCOUNTER — Inpatient Hospital Stay: Payer: Self-pay | Admitting: Internal Medicine

## 2016-12-21 ENCOUNTER — Encounter: Payer: Self-pay | Admitting: *Deleted

## 2016-12-21 ENCOUNTER — Emergency Department: Payer: Worker's Compensation

## 2016-12-21 ENCOUNTER — Emergency Department
Admission: EM | Admit: 2016-12-21 | Discharge: 2016-12-21 | Disposition: A | Discharge status: Home / Self Care | Payer: Worker's Compensation | Attending: Student in an Organized Health Care Education/Training Program | Admitting: Student in an Organized Health Care Education/Training Program

## 2016-12-21 DIAGNOSIS — M25532 Pain in left wrist: Secondary | ICD-10-CM

## 2016-12-21 DIAGNOSIS — Z79899 Other long term (current) drug therapy: Secondary | ICD-10-CM | POA: Diagnosis not present

## 2016-12-21 DIAGNOSIS — R200 Anesthesia of skin: Secondary | ICD-10-CM | POA: Insufficient documentation

## 2016-12-21 DIAGNOSIS — F17210 Nicotine dependence, cigarettes, uncomplicated: Secondary | ICD-10-CM | POA: Insufficient documentation

## 2016-12-21 DIAGNOSIS — M79602 Pain in left arm: Secondary | ICD-10-CM | POA: Diagnosis present

## 2016-12-21 DIAGNOSIS — G5602 Carpal tunnel syndrome, left upper limb: Secondary | ICD-10-CM

## 2016-12-21 DIAGNOSIS — M25432 Effusion, left wrist: Secondary | ICD-10-CM

## 2016-12-21 MED ORDER — PREDNISONE 20 MG PO TABS
60.0000 mg | ORAL_TABLET | Freq: Once | ORAL | Status: AC
  Administered 2016-12-21: 60 mg via ORAL
  Filled 2016-12-21: qty 3

## 2016-12-21 MED ORDER — KETOROLAC TROMETHAMINE 30 MG/ML IJ SOLN
30.0000 mg | Freq: Once | INTRAMUSCULAR | Status: AC
  Administered 2016-12-21: 30 mg via INTRAMUSCULAR
  Filled 2016-12-21: qty 1

## 2016-12-21 MED ORDER — PREDNISONE 10 MG PO TABS: 10.0000 mg | ORAL_TABLET | Freq: Every day | ORAL | 0 refills | Status: DC

## 2016-12-21 NOTE — ED Notes (Signed)
First nurse note: pt sent from [facility] for further evaluation of shoulder.

## 2016-12-21 NOTE — ED Triage Notes (Signed)
Pt injuried self at work 9/18. Pt states today he began having progressive numbness and tingling in L arm and hand. Pt states now he cannot feel his 1st,  2nd, and 5th digits.

## 2016-12-21 NOTE — ED Notes (Signed)
Patient reports fall at work on the 9/18 resulting in left arm/back pain. Pt reports loss of sensation in 1st, 2nd and 5th digit left hand and  and loss of movement in 1st and 2nd digit left hand. Pt reports prescribed pain medications are not effective for pain.

## 2016-12-21 NOTE — Discharge Instructions (Signed)
Please work on gentle finger range of motion. Wear Velcro wrist brace. Keep left upper extremity elevated. Take prednisone as prescribed. Please return to the emergency department for any increasing pain, increasing numbness, worsening symptoms or changes in health.

## 2016-12-21 NOTE — ED Notes (Signed)
Pt stated "pain is 10x's worse with splint on." Removed splint per Cranston Neighbor, PA.

## 2016-12-21 NOTE — ED Provider Notes (Signed)
[facility]-EMERGENCY DEPARTMENT Provider Note   CSN: [id number] Arrival date & time: 12/21/16  1930     History   Chief Complaint Chief Complaint  Patient presents with  . Arm Pain    HPI Jason Collier is a 26 y.o. male presents to the emergency department for evaluation of left arm pain. Most of his pain is along the left wrist. Patient states on 12/18/2016 patient slipped on an apple at work, landed on his left shoulder blade and developed pain in his left shoulder, elbow and wrist. [facility] and had x-rays of his shoulder, humerus, forearm, wrist and hand, states all of these x-rays were negative. No documentation was brought today. Today, patient returned to the urgent care facility after having increasing pain and swelling in his left hand and wrist. He also developed some numbness and tingling in the thumb index middle fingers of the left hand. He was started on gabapentin 300 mg and taken out of work. He was placed into a sling. Follow-up was given with orthopedics.  Due to no improvement with gabapentin he was sent to the emergency department for further evaluation.patient complains of diffuse pain throughout his left shoulder, upper arm, forearm, wrist and digits. Pain in his shoulder and elbow is mild but the pain in his wrist and hand and digits is more moderate, 8 out of 10. Patient points to his wrist as the source of most severe pain he describes it as a constant aching pain with swelling. Patient states the swelling has been improving since this morning. He said numbness that has been constant and the thumb index and middle finger of the left hand. He states he is unable to move the digits but active range of motion is visualized. He denies taking any medications today except for 300 mg of gabapentin. He denies any neck pain, pain with neck range of motion or numbness or tingling radiating into the shoulder or upper arm.    HPI  Past Medical  History:  Diagnosis Date  . Anxiety   . Environmental and seasonal allergies    can lead to brochitis  . Kidney infection 2015   history of    Patient Active Problem List   Diagnosis Date Noted  . Acute hypoxemic respiratory failure (HCC) 03/19/2016  . Encounter for orogastric (OG) tube placement   . Aspiration into airway   . Neck mass     Past Surgical History:  Procedure Laterality Date  . KNEE ARTHROSCOPY WITH LATERAL MENISECTOMY Right 07/26/2015   Procedure: KNEE ARTHROSCOPY WITH LATERAL MENISECTOMY, DEBRIDEMENT OF FAT PAD;  Surgeon: Kennedy Bucker, MD;  Location: [facility] ORS;  Service: Orthopedics;  Laterality: Right;  . NO PAST SURGERIES         Home Medications    Prior to Admission medications   Medication Sig Start Date End Date Taking? Authorizing Provider  FLUoxetine (PROZAC) 20 MG tablet Take 20 mg by mouth daily.   Yes [provider]  gabapentin (NEURONTIN) 300 MG capsule Take 300 mg by mouth 3 (three) times daily.   Yes [provider]  meloxicam (MOBIC) 15 MG tablet Take 15 mg by mouth daily.   Yes [provider]  acetaminophen (TYLENOL) 325 MG tablet Take 325 mg by mouth every 6 (six) hours as needed for headache.    [provider]  albuterol-ipratropium (COMBIVENT) 18-103 MCG/ACT inhaler Inhale 1-2 puffs into the lungs every 6 (six) hours as needed for  wheezing or shortness of breath.    [provider]  clonazePAM (KLONOPIN) 0.5 MG tablet Take 0.5 mg by mouth 2 (two) times daily as needed for anxiety.    [provider]  clonazePAM (KLONOPIN) 1 MG tablet Take 1 mg by mouth at bedtime as needed for anxiety.    [provider]  CLONIDINE HCL PO Take 1 tablet by mouth 2 (two) times daily as needed (anxiety). Reported on 07/26/2015    [provider]  cyclobenzaprine (FLEXERIL) 10 MG tablet Take 10 mg by mouth 3 (three) times daily as needed for muscle spasms. Reported on 07/26/2015    [provider]  HYDROcodone-acetaminophen (NORCO) 5-325 MG tablet Take 1 tablet by mouth every 6 (six) hours as needed for moderate pain. 07/26/15   Kennedy Bucker, MD  ibuprofen (ADVIL,MOTRIN) 200 MG tablet Take 200-800 mg by mouth every 6 (six) hours as needed for headache.    [provider]  ibuprofen (ADVIL,MOTRIN) 800 MG tablet Take 200 mg by mouth every 8 (eight) hours as needed for moderate pain.    [provider]  meclizine (ANTIVERT) 25 MG tablet Take 25 mg by mouth 3 (three) times daily as needed for dizziness.    [provider]  ondansetron (ZOFRAN ODT) 4 MG disintegrating tablet Take 1 tablet (4 mg total) by mouth every 8 (eight) hours as needed for nausea or vomiting. 12/14/15   Gayla Doss, MD  oxyCODONE (ROXICODONE) 5 MG immediate release tablet 1-2 tabs every 6 hours as needed for severe pain. Do not drive or operate machinery while on this medication. Use stool softeners as medication may cause constipation. 07/27/15   Maurilio Lovely, MD  predniSONE (DELTASONE) 10 MG tablet Take 1 tablet (10 mg total) by mouth daily. 6,5,4,3,2,1 six day taper 12/21/16   Evon Slack, PA-C  sertraline (ZOLOFT) 50 MG tablet Take 50 mg by mouth daily. Reported on 07/26/2015    [provider]    Family History History reviewed. No pertinent family history.  Social History Social History  Substance Use Topics  . Smoking status: Current Every Day Smoker    Packs/day: 0.50    Types: Cigarettes  . Smokeless tobacco: Former Neurosurgeon  . Alcohol use 1.2 oz/week    2 Cans of beer per week     Comment: every three days     Allergies   Tramadol; Hydrocodone; Hydrocodone; and Tramadol   Review of Systems Review of Systems  Constitutional: Negative for fever.  Respiratory: Negative for shortness of breath.   Cardiovascular: Negative for chest pain.  Gastrointestinal: Negative for abdominal pain.  Genitourinary: Negative for difficulty urinating, dysuria and  urgency.  Musculoskeletal: Positive for arthralgias and joint swelling. Negative for back pain, myalgias and neck pain.  Skin: Negative for rash.  Neurological: Negative for dizziness and headaches.     Physical Exam Updated Vital Signs BP (!) 155/97 (BP Location: Right Arm)   Pulse 84   Temp 98.9 F (37.2 C) (Oral)   Resp 20   Ht  (1.803 m)   Wt 119.7 kg (264 lb) Comment: last weighed at urgent care today, 12/21/16  SpO2 99%   BMI 36.82 kg/m   Physical Exam  Constitutional: He is oriented to person, place, and time. He appears well-developed and well-nourished.  HENT:  Head: Normocephalic and atraumatic.  Eyes: Conjunctivae are normal.  Neck: Normal range of motion.  Cardiovascular: Normal rate.   Pulmonary/Chest: Effort normal. No respiratory distress.  Musculoskeletal:  Patient has no spinous process tenderness along the cervical spine. There is no left or right paravertebral muscle tenderness. No pain with cervical spine range of motion. Negative Spurling's test. Patient has mild soft tissue tenderness along the lateral deltoid, biceps, triceps and into the elbow. There is no soft tissue swelling, edema noted. He is nontender along the before meals joint, acromion, proximal humerus. He has limited active range of motion of the left shoulder. Patient has full range of motion of the elbow with only mild discomfort. No effusion throughout the elbow. No warmth or redness. Patient has tenderness and swelling along the distal forearm. He is extremely tender to palpation along the distal radius as well as the distal ulna. No skin breakdown noted. No warmth or redness. Patient has mild swelling throughout the digits with no warmth or redness present. He is able to actively move the digits but range of motion is limited. He has 2+ cap refill throughout the digits. Sensation is slightly decreased to light touch along the thumb index middle and ring finger but he is able to feel sharp  sensation. He has 2+ radial pulse.  Neurological: He is alert and oriented to person, place, and time.  Skin: Skin is warm. Capillary refill takes less than 2 seconds. All 5 digits of the left hand. No rash noted. No erythema. No pallor.  Psychiatric: He has a normal mood and affect. His behavior is normal. Thought content normal.     ED Treatments / Results  Labs (all labs ordered are listed, but only abnormal results are displayed) Labs Reviewed - No data to display  EKG  EKG Interpretation None       Radiology Dg Wrist Complete Left  Result Date: 12/21/2016 CLINICAL DATA:  Fall at work 12/18/2016 with left wrist pain. EXAM: LEFT WRIST - COMPLETE 3+ VIEW COMPARISON:  None. FINDINGS: There is no evidence of fracture or dislocation. There is no evidence of arthropathy or other focal bone abnormality. Soft tissues are unremarkable. IMPRESSION: Negative. Electronically Signed   By: Elberta Fortis M.D.   On: 12/21/2016 21:24    Procedures Procedures (including critical care time)  Medications Ordered in ED Medications  predniSONE (DELTASONE) tablet 60 mg (not administered)  ketorolac (TORADOL) 30 MG/ML injection 30 mg (30 mg Intramuscular Given 12/21/16 2133)     Initial Impression / Assessment and Plan / ED Course  I have reviewed the triage vital signs and the nursing notes.  Pertinent labs & imaging results that were available during my care of the patient were reviewed by me and considered in my medical decision making (see chart for details).     26 year old male with Worker's Comp injury that occurred on the 18th. He states he has had negative x-rays of the shoulder humerus elbow forearm wrist and hand. He states x-rays were read by radiologist as negative. He is not concerned of any possible fractures to his upper extremity but is concerned about the pain and swelling he is having in his left wrist and hand. Patient has mild to moderate swelling throughout the wrist and  digits. There is no concern for compartment syndrome of the upperextremity at this time due to him being able to move his wrist and digits as well as having sensation distally as well as 2+ radial pulse and 2+ cap refill. Patient states the swelling that he was having this morning has improved. X-rays today show no evidence of acute bony abnormality. Mild widening of the scapholunate  joint concerning for possible ligamentous injury. Patient also with numbness and tingling and a median nerve distribution concerning for possible carpal tunnel syndrome. Patient will continue with his sling and continue to work on gentle digit range of motion. He is placed on a 6 day steroid taper. He will continue with gabapentin at nighttime. Patient will follow-up with orthopedics. He is encouraged to return to the emergency department for any increasing pain, numbness, swelling, worsening symptoms or changes in his health.  Final Clinical Impressions(s) / ED Diagnoses   Final diagnoses:  Left arm pain  Left wrist pain  Wrist swelling, left  Numbness of left hand  Carpal tunnel syndrome of left wrist    New Prescriptions New Prescriptions   PREDNISONE (DELTASONE) 10 MG TABLET    Take 1 tablet (10 mg total) by mouth daily. 6,5,4,3,2,1 six day taper     Evon Slack, PA-C 12/21/16 2248    Evon Slack, PA-C 12/21/16 2300    Willy Eddy, MD 12/21/16 2314

## 2017-01-03 ENCOUNTER — Other Ambulatory Visit: Payer: Self-pay | Admitting: Orthopedic Surgery

## 2017-01-03 DIAGNOSIS — M25512 Pain in left shoulder: Secondary | ICD-10-CM

## 2017-01-04 ENCOUNTER — Other Ambulatory Visit: Payer: Self-pay | Admitting: Orthopedic Surgery

## 2017-01-04 DIAGNOSIS — M5412 Radiculopathy, cervical region: Secondary | ICD-10-CM

## 2017-01-18 ENCOUNTER — Ambulatory Visit
Admission: RE | Admit: 2017-01-18 | Discharge: 2017-01-18 | Disposition: A | Discharge status: Home / Self Care | Payer: Worker's Compensation | Source: Ambulatory Visit | Attending: Orthopedic Surgery | Admitting: Orthopedic Surgery

## 2017-01-18 DIAGNOSIS — M5412 Radiculopathy, cervical region: Secondary | ICD-10-CM

## 2017-01-18 DIAGNOSIS — M25512 Pain in left shoulder: Secondary | ICD-10-CM

## 2017-01-18 MED ORDER — IOPAMIDOL (ISOVUE-M 200) INJECTION 41%
15.0000 mL | Freq: Once | INTRAMUSCULAR | Status: AC
  Administered 2017-01-18: 15 mL via INTRA_ARTICULAR

## 2017-01-27 ENCOUNTER — Emergency Department
Admission: EM | Admit: 2017-01-27 | Discharge: 2017-01-28 | Disposition: A | Discharge status: Home / Self Care | Payer: Self-pay | Attending: Emergency Medicine | Admitting: Emergency Medicine

## 2017-01-27 DIAGNOSIS — F332 Major depressive disorder, recurrent severe without psychotic features: Secondary | ICD-10-CM

## 2017-01-27 DIAGNOSIS — F17210 Nicotine dependence, cigarettes, uncomplicated: Secondary | ICD-10-CM | POA: Insufficient documentation

## 2017-01-27 DIAGNOSIS — R45851 Suicidal ideations: Secondary | ICD-10-CM | POA: Insufficient documentation

## 2017-01-27 DIAGNOSIS — T424X1A Poisoning by benzodiazepines, accidental (unintentional), initial encounter: Secondary | ICD-10-CM

## 2017-01-27 DIAGNOSIS — M25532 Pain in left wrist: Secondary | ICD-10-CM

## 2017-01-27 DIAGNOSIS — Z79899 Other long term (current) drug therapy: Secondary | ICD-10-CM | POA: Insufficient documentation

## 2017-01-27 DIAGNOSIS — T424X4A Poisoning by benzodiazepines, undetermined, initial encounter: Secondary | ICD-10-CM | POA: Insufficient documentation

## 2017-01-27 DIAGNOSIS — F410 Panic disorder [episodic paroxysmal anxiety] without agoraphobia: Secondary | ICD-10-CM

## 2017-01-27 DIAGNOSIS — R440 Auditory hallucinations: Secondary | ICD-10-CM | POA: Insufficient documentation

## 2017-01-27 LAB — CBC
HEMATOCRIT: 45.8 % (ref 40.0–52.0)
Hemoglobin: 15.5 g/dL (ref 13.0–18.0)
MCH: 28.2 pg (ref 26.0–34.0)
MCHC: 33.9 g/dL (ref 32.0–36.0)
MCV: 83.2 fL (ref 80.0–100.0)
PLATELETS: 251 10*3/uL (ref 150–440)
RBC: 5.51 MIL/uL (ref 4.40–5.90)
RDW: 14.6 % — AB (ref 11.5–14.5)
WBC: 8.7 10*3/uL (ref 3.8–10.6)

## 2017-01-27 LAB — ACETAMINOPHEN LEVEL

## 2017-01-27 LAB — COMPREHENSIVE METABOLIC PANEL
ALT: 33 U/L (ref 17–63)
ANION GAP: 8 (ref 5–15)
AST: 33 U/L (ref 15–41)
Albumin: 4.4 g/dL (ref 3.5–5.0)
Alkaline Phosphatase: 74 U/L (ref 38–126)
BILIRUBIN TOTAL: 0.3 mg/dL (ref 0.3–1.2)
BUN: 9 mg/dL (ref 6–20)
CO2: 26 mmol/L (ref 22–32)
Calcium: 9.3 mg/dL (ref 8.9–10.3)
Chloride: 104 mmol/L (ref 101–111)
Creatinine, Ser: 1.14 mg/dL (ref 0.61–1.24)
GLUCOSE: 109 mg/dL — AB (ref 65–99)
Potassium: 3.8 mmol/L (ref 3.5–5.1)
Sodium: 138 mmol/L (ref 135–145)
TOTAL PROTEIN: 8 g/dL (ref 6.5–8.1)

## 2017-01-27 LAB — SALICYLATE LEVEL

## 2017-01-27 LAB — ETHANOL

## 2017-01-27 NOTE — ED Notes (Signed)
Pt was dressed out in clothes mother took all belongings.

## 2017-01-27 NOTE — ED Triage Notes (Signed)
Reports hearing voices and having suicidal thoughts.  Reports took to much of his Klonopin. Patient states he took the Klonopin to make the voices stop.  When ask what voices are saying reports "alot".

## 2017-01-28 DIAGNOSIS — F332 Major depressive disorder, recurrent severe without psychotic features: Secondary | ICD-10-CM

## 2017-01-28 DIAGNOSIS — F410 Panic disorder [episodic paroxysmal anxiety] without agoraphobia: Secondary | ICD-10-CM

## 2017-01-28 DIAGNOSIS — T424X1A Poisoning by benzodiazepines, accidental (unintentional), initial encounter: Secondary | ICD-10-CM

## 2017-01-28 MED ORDER — QUETIAPINE FUMARATE 50 MG PO TABS: 100.0000 mg | ORAL_TABLET | Freq: Every day | ORAL | 1 refills | Status: DC

## 2017-01-28 MED ORDER — OLANZAPINE 5 MG PO TABS
5.0000 mg | ORAL_TABLET | Freq: Every day | ORAL | Status: DC
  Administered 2017-01-28: 5 mg via ORAL
  Filled 2017-01-28: qty 1

## 2017-01-28 NOTE — ED Notes (Signed)
Pt to be discharged today. Pt remains calm and cooperative. Maintained on 15 minute checks and observation by security camera for safety.

## 2017-01-28 NOTE — ED Notes (Signed)
Pt visiting with fiance in dayroom. Calm and cooperative. Maintained on 15 minute checks and observation by security camera for safety.
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
## 2017-01-28 NOTE — ED Notes (Signed)
Pt's fiance will bring clothes and bring patient home at 1600. Maintained on 15 minute checks and observation by security camera for safety.

## 2017-01-28 NOTE — ED Notes (Signed)
Pt is groggy on admission but follows instructions from staff. Pt is visibly tired on arrival, but denies SI/HI and AVH at this time. Writer provided additional blankets and oriented pt to the BHU. TTS interviewing pt at this time. 15 minute checks ongoing for safety.

## 2017-01-28 NOTE — ED Provider Notes (Signed)
Patient seen and cleared by Dr. Deborah Chalklay Paxinos and very well he will continue to follow [facility] and cleared by Dr. Mat Carnelay packs who knows him very well he will continue to follow with his primary care doctor and [facility] one is duplicating everything it is no better [place] is no better than the old dragon for me at least   Arnaldo NatalMalinda, Nichlas Pitera F, MD 01/28/17 1525

## 2017-01-28 NOTE — ED Provider Notes (Signed)
[facility] Emergency Department Provider Note   ____________________________________________   First MD Initiated Contact with Patient 01/27/17 2354     (approximate)  I have reviewed the triage vital signs and the nursing notes.   HISTORY  Chief Complaint Drug Overdose    HPI Jason Collier is a 26 y.o. male Who comes into the hospital today for hearing voices. He reports that he's trying to get the voices out of his head. The patient reports that he thought his anxiety medicine woodwork. He states he took 3 Klonopin and then a few hours later took 4 more and then a few arms later took 6 more. He reports that he's heard voices in the past but initially they were just his voice. He considered if his self-conscious but the patient states that for the past 2 years he's been hearing 3 voices. 2 were mainly positive but one voice which he named the bad man has been telling him to kill himself. He reports that the voice has been telling himthat everything would be better off if he were not around. The patient reports that his fianc is pregnant and he has been trying to get his life together. He reports that he cannot tolerate the voices anymore. He's been smoking marijuana to try that all the voices and states that he in the past her strength alcohol. Tonight he states that he was in the field for 3 hours considering climbing up a tree and jumping off to kill himself. The patient states that he has been on multiple medications including Klonopin, Lexapro and Prozac. He reports that nothing seems to be working. He states that he went home and spoke with his fiance and they agreed that he should come in for evaluation. He is here today for evaluation.   Past Medical History:  Diagnosis Date  . Anxiety   . Environmental and seasonal allergies    can lead to brochitis  . Kidney infection 2015   history of    Patient Active Problem List   Diagnosis Date Noted  .  Acute hypoxemic respiratory failure (HCC) 03/19/2016  . Encounter for orogastric (OG) tube placement   . Aspiration into airway   . Neck mass     Past Surgical History:  Procedure Laterality Date  . KNEE ARTHROSCOPY WITH LATERAL MENISECTOMY Right 07/26/2015   Procedure: KNEE ARTHROSCOPY WITH LATERAL MENISECTOMY, DEBRIDEMENT OF FAT PAD;  Surgeon: Kennedy BuckerMichael Menz, MD;  Location: [facility] ORS;  Service: Orthopedics;  Laterality: Right;  . NO PAST SURGERIES      Prior to Admission medications   Medication Sig Start Date End Date Taking? Authorizing Provider  acetaminophen (TYLENOL) 325 MG tablet Take 325 mg by mouth every 6 (six) hours as needed for headache.    [provider]  albuterol-ipratropium (COMBIVENT) 18-103 MCG/ACT inhaler Inhale 1-2 puffs into the lungs every 6 (six) hours as needed for wheezing or shortness of breath.    [provider]  clonazePAM (KLONOPIN) 0.5 MG tablet Take 0.5 mg by mouth 2 (two) times daily as needed for anxiety.    [provider]  clonazePAM (KLONOPIN) 1 MG tablet Take 1 mg by mouth at bedtime as needed for anxiety.    [provider]  CLONIDINE HCL PO Take 1 tablet by mouth 2 (two) times daily as needed (anxiety). Reported on 07/26/2015    [provider]  cyclobenzaprine (FLEXERIL) 10 MG tablet Take 10 mg by mouth 3 (three) times daily as  needed for muscle spasms. Reported on 07/26/2015    [provider]  FLUoxetine (PROZAC) 20 MG tablet Take 20 mg by mouth daily.    [provider]  gabapentin (NEURONTIN) 300 MG capsule Take 300 mg by mouth 3 (three) times daily.    [provider]  HYDROcodone-acetaminophen (NORCO) 5-325 MG tablet Take 1 tablet by mouth every 6 (six) hours as needed for moderate pain. 07/26/15   Kennedy Bucker, MD  ibuprofen (ADVIL,MOTRIN) 200 MG tablet Take 200-800 mg by mouth every 6 (six) hours as needed for headache.    [provider]  ibuprofen (ADVIL,MOTRIN)  800 MG tablet Take 200 mg by mouth every 8 (eight) hours as needed for moderate pain.    [provider]  meclizine (ANTIVERT) 25 MG tablet Take 25 mg by mouth 3 (three) times daily as needed for dizziness.    [provider]  meloxicam (MOBIC) 15 MG tablet Take 15 mg by mouth daily.    [provider]  ondansetron (ZOFRAN ODT) 4 MG disintegrating tablet Take 1 tablet (4 mg total) by mouth every 8 (eight) hours as needed for nausea or vomiting. 12/14/15   Gayla Doss, MD  oxyCODONE (ROXICODONE) 5 MG immediate release tablet 1-2 tabs every 6 hours as needed for severe pain. Do not drive or operate machinery while on this medication. Use stool softeners as medication may cause constipation. 07/27/15   Maurilio Lovely, MD  predniSONE (DELTASONE) 10 MG tablet Take 1 tablet (10 mg total) by mouth daily. 6,5,4,3,2,1 six day taper 12/21/16   Evon Slack, PA-C  sertraline (ZOLOFT) 50 MG tablet Take 50 mg by mouth daily. Reported on 07/26/2015    [provider]    Allergies Tramadol; Hydrocodone; Hydrocodone; and Tramadol  No family history on file.  Social History Social History  Substance Use Topics  . Smoking status: Current Every Day Smoker    Packs/day: 0.50    Types: Cigarettes  . Smokeless tobacco: Former Neurosurgeon  . Alcohol use 1.2 oz/week    2 Cans of beer per week     Comment: every three days    Review of Systems  Constitutional: No fever/chills Eyes: No visual changes. ENT: No sore throat. Cardiovascular: Denies chest pain. Respiratory: Denies shortness of breath. Gastrointestinal: No abdominal pain.  No nausea, no vomiting.  No diarrhea.  No constipation. Genitourinary: Negative for dysuria. Musculoskeletal: Negative for back pain. Skin: Negative for rash. Neurological: Negative for headaches, focal weakness or numbness. Psychiatric:suicidal ideation, auditory  hallucinations   ____________________________________________   PHYSICAL EXAM:  VITAL SIGNS: ED Triage Vitals  Enc Vitals Group     BP 01/27/17 2256 138/84     Pulse Rate 01/27/17 2256 76     Resp 01/27/17 2256 20     Temp 01/27/17 2256 98.6 F (37 C)     Temp Source 01/27/17 2256 Oral     SpO2 01/27/17 2256 96 %     Weight 01/27/17 2257 270 lb (122.5 kg)     Height 01/27/17 2257 5\' 11"  (1.803 m)     Head Circumference --      Peak Flow --      Pain Score 01/27/17 2256 6     Pain Loc --      Pain Edu? --      Excl. in GC? --     Constitutional: Alert and oriented. Well appearing and in no acute distress. Eyes: Conjunctivae are normal. PERRL. EOMI. Head: Atraumatic. Nose:  No congestion/rhinnorhea. Mouth/Throat: Mucous membranes are moist.  Oropharynx non-erythematous. Cardiovascular: Normal rate, regular rhythm. Grossly normal heart sounds.  Good peripheral circulation. Respiratory: Normal respiratory effort.  No retractions. Lungs CTAB. Gastrointestinal: Soft and nontender. No distention. No abdominal bruits. No CVA tenderness. Musculoskeletal: No lower extremity tenderness nor edema.  No joint effusions. Neurologic:  Normal speech and language.  Skin:  Skin is warm, dry and intact. Marland Kitchen Psychiatric: Mood and affect are normal. Speech and behavior are normal.  ____________________________________________   LABS (all labs ordered are listed, but only abnormal results are displayed)  Labs Reviewed  COMPREHENSIVE METABOLIC PANEL - Abnormal; Notable for the following:       Result Value   Glucose, Bld 109 (*)    All other components within normal limits  ACETAMINOPHEN LEVEL - Abnormal; Notable for the following:    Acetaminophen (Tylenol), Serum <10 (*)    All other components within normal limits  CBC - Abnormal; Notable for the following:    RDW 14.6 (*)    All other components within normal limits  ETHANOL  SALICYLATE LEVEL  URINE DRUG SCREEN, QUALITATIVE ([facility]  ONLY)   ____________________________________________  EKG  ED ECG REPORT I, Rebecka Apley, the attending physician, personally viewed and interpreted this ECG.   Date: 01/28/2017  EKG Time: 102  Rate: 78  Rhythm: normal sinus rhythm  Axis: normal  Intervals:none  ST&T Change: flipped T waves in lead III  ____________________________________________  RADIOLOGY  No results found.  ____________________________________________   PROCEDURES  Procedure(s) performed: None  Procedures  Critical Care performed: No  ____________________________________________   INITIAL IMPRESSION / ASSESSMENT AND PLAN / ED COURSE  As part of my medical decision making, I reviewed the following data within the electronic MEDICAL RECORD NUMBER Notes from prior ED visits and [place] Controlled Substance Database   This is a 26 year old male who comes into the hospital today after an overdose on Klonopin. The patient has been taking Klonopin all day long to try to get rid of the voices in his head. The patient is suicidal and is hearing voices. He's been having these symptoms for multiple years. The patient states that he does not feel that he has been properly diagnosed.  My differential diagnosis includes schizophrenia, schizoaffective disorder, acute psychosis.  The patient is calm at this time. I will have the patient evaluated by psychiatry for further treatment of his symptoms.      ____________________________________________   FINAL CLINICAL IMPRESSION(S) / ED DIAGNOSES  Final diagnoses:  Auditory hallucinations      NEW MEDICATIONS STARTED DURING THIS VISIT:  New Prescriptions   No medications on file     Note:  This document was prepared using Dragon voice recognition software and may include unintentional dictation errors.    Rebecka Apley, MD 01/28/17 [id number]

## 2017-01-28 NOTE — ED Notes (Signed)
Pt discharged to lobby. Pt signed for discharge paperwork and given prescription. No belongings in the hospital. Fiance brought clothes for patient to wear home. Pt denies SI/HI and AVH.

## 2017-01-28 NOTE — ED Notes (Signed)
Patient asleep in room. No noted distress or abnormal behavior. Will continue 15 minute checks and observation by security cameras for safety. 

## 2017-01-28 NOTE — Discharge Instructions (Signed)
Please return for any further problems. Follow-up with your primary care doctor and [facility].

## 2017-01-28 NOTE — Consult Note (Signed)
[facility] Consult   Reason for Consult: Consult for 26 year old man who came into the hospital after overdosing on clonazepam Referring Physician: Rip Harbour Patient Identification: Jason Collier MRN:  [id number] Principal Diagnosis: Moderately severe recurrent major depression ([place]) Diagnosis:   Patient Active Problem List   Diagnosis Date Noted  . Moderately severe recurrent major depression ([place]) [F33.2] 01/28/2017  . Panic attacks [F41.0] 01/28/2017  . Benzodiazepine overdose [T42.4X1A] 01/28/2017  . Acute hypoxemic respiratory failure ([place]) [J96.01] 03/19/2016  . Encounter for orogastric (OG) tube placement [Z46.59]   . Aspiration into airway [T17.908A]   . Neck mass [R22.1]     Total Time spent with patient: 1 hour  Subjective:   Jason Collier is a 26 y.o. male patient admitted with "I had an attack that went wrong".  HPI: Patient interviewed chart reviewed.  Patient reports that prior to coming into the hospital he had an anxiety attack of some sort.  He said that his mind races like the [place].  It does this much of the time and will go on for hours at a time.  He says when it does he has both good thoughts and bad thoughts.  His mood has been very anxious.  It sounds a little bit panicky but he does not really emphasize the physical symptoms of panic attacks and it goes on for hours at a time.  He feels negative and depressed much of the time.  Not functioning very well.  Not currently working.  He denies that he was trying to kill himself.  Says that he has been on clonazepam prescribed by his outpatient provider and he tried to take some to slow his mind down.  Patient is seeing an outpatient provider, his primary care doctor, and intends to try to see a psychiatrist.  Patient admits that he does have auditory hallucinations at times he is vague as to whether they really sound like voices and he seems to have some insight that they are not real.  He uses  marijuana but denies other drugs.  Major stresses include financial problems.  Medical history: Patient has had some significant medical issues in the past with aspiration and intervention for a neck mass but does not have active ongoing medical issues at this time.  Substance abuse history: Uses marijuana pretty regularly.  Denies alcohol use regularly denies any other drug use.  Social history: Lives with his girlfriend.  Not currently working.  Gets very nervous and has trouble holding a job.  Past Psychiatric History: No previous hospitalizations.  No history of suicide attempts.  No history of violence.  He has been on Lexapro in the past which made him very anxious.  Currently taking Prozac and clonazepam.  Does not think the clonazepam really helps at all.  Risk to Self: Suicidal Ideation: No Suicidal Intent: No Is patient at risk for suicide?: No Suicidal Plan?: No Access to Means: No What has been your use of drugs/alcohol within the last 12 months?: use of marijuana How many times?: 1 Other Self Harm Risks: denied Triggers for Past Attempts: Unknown Intentional Self Injurious Behavior: None Risk to Others: Homicidal Ideation: No Thoughts of Harm to Others: No Current Homicidal Intent: No Current Homicidal Plan: No Access to Homicidal Means: No Identified Victim: None identified History of harm to others?: No Assessment of Violence: None Noted Does patient have access to weapons?: No Criminal Charges Pending?: No Does patient have a court date: No Prior Inpatient Therapy: Prior  Inpatient Therapy: No Prior Therapy Dates: n/a Prior Therapy Facilty/Provider(s): n/a Reason for Treatment: n/a Prior Outpatient Therapy: Prior Outpatient Therapy: Yes Prior Therapy Dates: Unsure Prior Therapy Facilty/Provider(s): Unsure Reason for Treatment: n/a Does patient have an ACCT team?: No Does patient have Intensive In-House Services?  : No Does patient have [facility] services? :  No Does patient have P4CC services?: No  Past Medical History:  Past Medical History:  Diagnosis Date  . Anxiety   . Environmental and seasonal allergies    can lead to brochitis  . Kidney infection 2015   history of    Past Surgical History:  Procedure Laterality Date  . KNEE ARTHROSCOPY WITH LATERAL MENISECTOMY Right 07/26/2015   Procedure: KNEE ARTHROSCOPY WITH LATERAL MENISECTOMY, DEBRIDEMENT OF FAT PAD;  Surgeon: Hessie Knows, MD;  Location: [facility] ORS;  Service: Orthopedics;  Laterality: Right;  . NO PAST SURGERIES     Family History: No family history on file. Family Psychiatric  History: Mother has depression Social History:  History  Alcohol Use  . 1.2 oz/week  . 2 Cans of beer per week    Comment: every three days     History  Drug Use No    Comment: over 1 year ago    Social History   Social History  . Marital status: Single    Spouse name: N/A  . Number of children: N/A  . Years of education: N/A   Social History Main Topics  . Smoking status: Current Every Day Smoker    Packs/day: 0.50    Types: Cigarettes  . Smokeless tobacco: Former Systems developer  . Alcohol use 1.2 oz/week    2 Cans of beer per week     Comment: every three days  . Drug use: No     Comment: over 1 year ago  . Sexual activity: Not on file   Other Topics Concern  . Not on file   Social History Narrative  . No narrative on file   Additional Social History:    Allergies:   Allergies  Allergen Reactions  . Tramadol Itching  . Hydrocodone Nausea And Vomiting  . Hydrocodone Nausea And Vomiting  . Tramadol Itching    Labs:  Results for orders placed or performed during the hospital encounter of 01/27/17 (from the past 48 hour(s))  Comprehensive metabolic panel     Status: Abnormal   Collection Time: 01/27/17 11:10 PM  Result Value Ref Range   Sodium 138 135 - 145 mmol/L   Potassium 3.8 3.5 - 5.1 mmol/L   Chloride 104 101 - 111 mmol/L   CO2 26 22 - 32 mmol/L   Glucose, Bld 109  (H) 65 - 99 mg/dL   BUN 9 6 - 20 mg/dL   Creatinine, Ser 1.14 0.61 - 1.24 mg/dL   Calcium 9.3 8.9 - 10.3 mg/dL   Total Protein 8.0 6.5 - 8.1 g/dL   Albumin 4.4 3.5 - 5.0 g/dL   AST 33 15 - 41 U/L   ALT 33 17 - 63 U/L   Alkaline Phosphatase 74 38 - 126 U/L   Total Bilirubin 0.3 0.3 - 1.2 mg/dL   GFR calc non Af Amer >60 >60 mL/min   GFR calc Af Amer >60 >60 mL/min    Comment: (NOTE) The eGFR has been calculated using the CKD EPI equation. This calculation has not been validated in all clinical situations. eGFR's persistently <60 mL/min signify possible Chronic Kidney Disease.    Anion gap 8 5 - 15  Ethanol     Status: None   Collection Time: 01/27/17 11:10 PM  Result Value Ref Range   Alcohol, Ethyl (B) <10 <10 mg/dL    Comment:        LOWEST DETECTABLE LIMIT FOR SERUM ALCOHOL IS 10 mg/dL FOR MEDICAL PURPOSES ONLY   Salicylate level     Status: None   Collection Time: 01/27/17 11:10 PM  Result Value Ref Range   Salicylate Lvl <2.7 2.8 - 30.0 mg/dL  Acetaminophen level     Status: Abnormal   Collection Time: 01/27/17 11:10 PM  Result Value Ref Range   Acetaminophen (Tylenol), Serum <10 (L) 10 - 30 ug/mL    Comment:        THERAPEUTIC CONCENTRATIONS VARY SIGNIFICANTLY. A RANGE OF 10-30 ug/mL MAY BE AN EFFECTIVE CONCENTRATION FOR MANY PATIENTS. HOWEVER, SOME ARE BEST TREATED AT CONCENTRATIONS OUTSIDE THIS RANGE. ACETAMINOPHEN CONCENTRATIONS >150 ug/mL AT 4 HOURS AFTER INGESTION AND >50 ug/mL AT 12 HOURS AFTER INGESTION ARE OFTEN ASSOCIATED WITH TOXIC REACTIONS.   cbc     Status: Abnormal   Collection Time: 01/27/17 11:10 PM  Result Value Ref Range   WBC 8.7 3.8 - 10.6 K/uL   RBC 5.51 4.40 - 5.90 MIL/uL   Hemoglobin 15.5 13.0 - 18.0 g/dL   HCT 45.8 40.0 - 52.0 %   MCV 83.2 80.0 - 100.0 fL   MCH 28.2 26.0 - 34.0 pg   MCHC 33.9 32.0 - 36.0 g/dL   RDW 14.6 (H) 11.5 - 14.5 %   Platelets 251 150 - 440 K/uL    No current facility-administered medications for this  encounter.    Current Outpatient Prescriptions  Medication Sig Dispense Refill  . acetaminophen (TYLENOL) 325 MG tablet Take 325 mg by mouth every 6 (six) hours as needed for headache.    . albuterol-ipratropium (COMBIVENT) 18-103 MCG/ACT inhaler Inhale 1-2 puffs into the lungs every 6 (six) hours as needed for wheezing or shortness of breath.    . clonazePAM (KLONOPIN) 0.5 MG tablet Take 0.5 mg by mouth 2 (two) times daily as needed for anxiety.    . clonazePAM (KLONOPIN) 1 MG tablet Take 1 mg by mouth at bedtime as needed for anxiety.    Marland Kitchen CLONIDINE HCL PO Take 1 tablet by mouth 2 (two) times daily as needed (anxiety). Reported on 07/26/2015    . cyclobenzaprine (FLEXERIL) 10 MG tablet Take 10 mg by mouth 3 (three) times daily as needed for muscle spasms. Reported on 07/26/2015    . FLUoxetine (PROZAC) 20 MG tablet Take 20 mg by mouth daily.    Marland Kitchen gabapentin (NEURONTIN) 300 MG capsule Take 300 mg by mouth 3 (three) times daily.    Marland Kitchen HYDROcodone-acetaminophen (NORCO) 5-325 MG tablet Take 1 tablet by mouth every 6 (six) hours as needed for moderate pain. 30 tablet 0  . ibuprofen (ADVIL,MOTRIN) 200 MG tablet Take 200-800 mg by mouth every 6 (six) hours as needed for headache.    . ibuprofen (ADVIL,MOTRIN) 800 MG tablet Take 200 mg by mouth every 8 (eight) hours as needed for moderate pain.    . meclizine (ANTIVERT) 25 MG tablet Take 25 mg by mouth 3 (three) times daily as needed for dizziness.    . meloxicam (MOBIC) 15 MG tablet Take 15 mg by mouth daily.    . ondansetron (ZOFRAN ODT) 4 MG disintegrating tablet Take 1 tablet (4 mg total) by mouth every 8 (eight) hours as needed for nausea or vomiting. 10 tablet 0  .  oxyCODONE (ROXICODONE) 5 MG immediate release tablet 1-2 tabs every 6 hours as needed for severe pain. Do not drive or operate machinery while on this medication. Use stool softeners as medication may cause constipation. 20 tablet 0  . predniSONE (DELTASONE) 10 MG tablet Take 1 tablet (10  mg total) by mouth daily. 6,5,4,3,2,1 six day taper 21 tablet 0  . QUEtiapine (SEROQUEL) 50 MG tablet Take 2 tablets (100 mg total) by mouth at bedtime. Take half pill up to twice a day as needed for anxiety.  Take 2 pills regularly every night for sleep and mood. 75 tablet 1  . sertraline (ZOLOFT) 50 MG tablet Take 50 mg by mouth daily. Reported on 07/26/2015      Musculoskeletal: Strength & Muscle Tone: within normal limits Gait & Station: normal Patient leans: N/A  Psychiatric Specialty Exam: Physical Exam  Nursing note and vitals reviewed. Constitutional: He appears well-developed and well-nourished.  HENT:  Head: Normocephalic and atraumatic.  Eyes: Pupils are equal, round, and reactive to light. Conjunctivae are normal.  Neck: Normal range of motion.  Cardiovascular: Regular rhythm and normal heart sounds.   Respiratory: Effort normal. No respiratory distress.  GI: Soft.  Musculoskeletal: Normal range of motion.  Neurological: He is alert.  Skin: Skin is warm and dry.  Psychiatric: His speech is normal and behavior is normal. Judgment and thought content normal. His mood appears anxious. Cognition and memory are normal.    Review of Systems  Constitutional: Negative.   HENT: Negative.   Eyes: Negative.   Respiratory: Negative.   Cardiovascular: Negative.   Gastrointestinal: Negative.   Musculoskeletal: Negative.   Skin: Negative.   Neurological: Negative.   Psychiatric/Behavioral: Positive for depression and hallucinations. Negative for memory loss, substance abuse and suicidal ideas. The patient is nervous/anxious and has insomnia.     Blood pressure 125/76, pulse 73, temperature 98.1 F (36.7 C), temperature source Oral, resp. rate 18, height _0  (1.803 m), weight 122.5 kg (270 lb), SpO2 100 %.Body mass index is 37.66 kg/m.  General Appearance: Fairly Groomed  Eye Contact:  Good  Speech:  Normal Rate  Volume:  Normal  Mood:  Euthymic  Affect:  Constricted   Thought Process:  Goal Directed  Orientation:  Full (Time, Place, and Person)  Thought Content:  Logical  Suicidal Thoughts:  No  Homicidal Thoughts:  No  Memory:  Immediate;   Good Recent;   Fair Remote;   Fair  Judgement:  Fair  Insight:  Fair  Psychomotor Activity:  Normal  Concentration:  Concentration: Fair  Recall:  [employer] of Knowledge:  Fair  Language:  Fair  Akathisia:  No  Handed:  Right  AIMS (if indicated):     Assets:  Desire for Improvement Housing Physical Health  ADL's:  Intact  Cognition:  WNL  Sleep:        Treatment Plan Summary: Medication management and Plan 26 year old man who is calm and appropriate in a pretty good historian.  Denies any suicidal intent.  Patient describes chronic depression but also racing thoughts and anxiety.  Does not sound like typical panic disorder.  Could possibly be mixed bipolar disorder.  Could just be anxious depression.  Patient wants to get off of his clonazepam.  He is asking for some other medicine to add to his Prozac.  I suggested Seroquel at 100 mg at night along with 25 mg as needed once or twice a day for anxiety.  Side effects discussed.  Patient  agrees to plan.  He is going to follow-up with [facility] within the next couple days.  Prescription provided.  Case reviewed with ER physician.  Patient can be discharged from the emergency room.  Disposition: Patient does not meet criteria for psychiatric inpatient admission. Supportive therapy provided about ongoing stressors.  Alethia Berthold, MD 01/28/2017 9:24 PM

## 2017-01-29 ENCOUNTER — Other Ambulatory Visit: Payer: Self-pay

## 2017-01-29 ENCOUNTER — Ambulatory Visit
Admission: RE | Admit: 2017-01-29 | Discharge: 2017-01-29 | Disposition: A | Discharge status: Home / Self Care | Payer: Worker's Compensation | Source: Ambulatory Visit | Attending: Orthopedic Surgery | Admitting: Orthopedic Surgery

## 2017-01-29 ENCOUNTER — Other Ambulatory Visit: Payer: Self-pay | Admitting: Orthopedic Surgery

## 2017-01-29 DIAGNOSIS — M25532 Pain in left wrist: Secondary | ICD-10-CM

## 2017-02-07 ENCOUNTER — Other Ambulatory Visit: Payer: Self-pay | Admitting: Orthopaedic Surgery

## 2017-02-07 DIAGNOSIS — M25532 Pain in left wrist: Secondary | ICD-10-CM

## 2017-05-21 IMAGING — US US SOFT TISSUE HEAD/NECK
1 series · 14 of 17 positions shown · non-contrast
Comparison: CT 03/18/2016

CLINICAL DATA: Right neck fullness

EXAM:
ULTRASOUND OF HEAD/NECK SOFT TISSUES
TECHNIQUE: Ultrasound examination of the head and neck soft tissues was
performed in the area of clinical concern.

[Series 1: us soft tissue head/neck · 0.06mm/px · 14 of 17 slices shown]
[im 1/17]
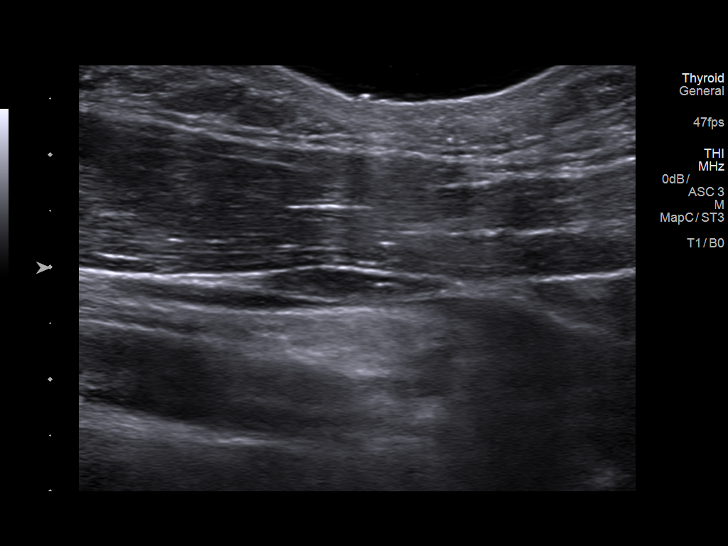
[im 2/17]
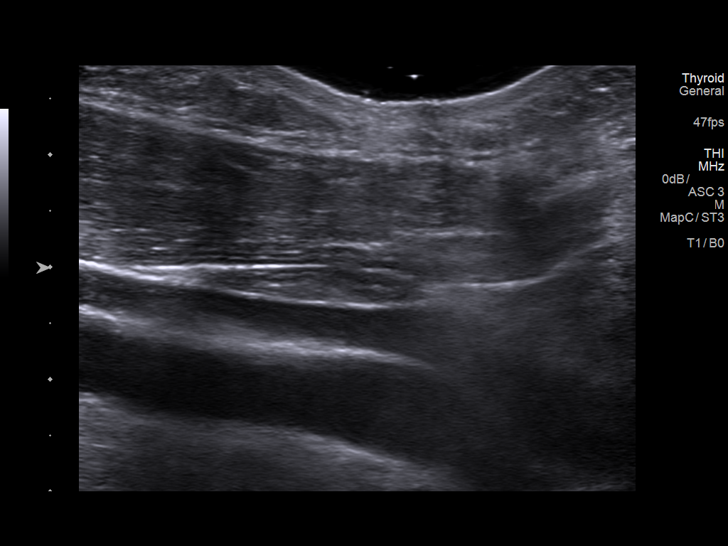
[im 4/17]
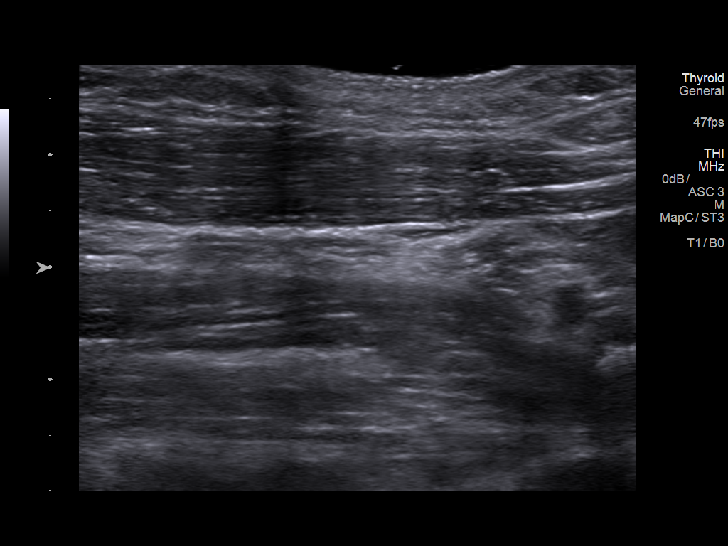
[im 5/17]
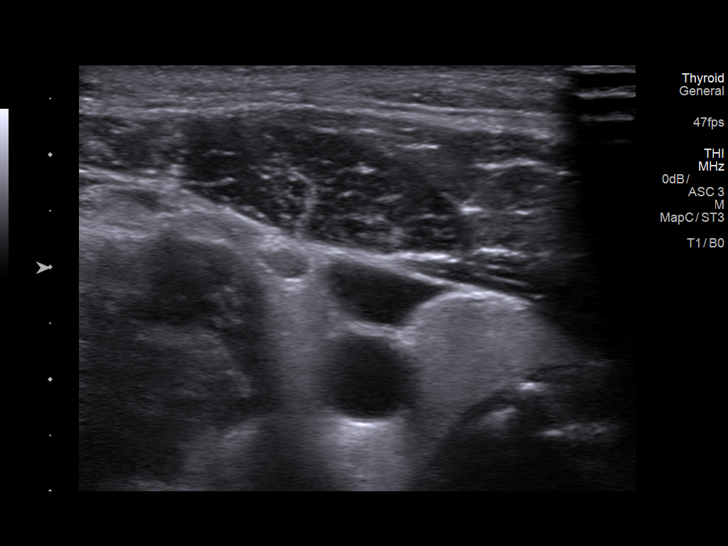
[im 6/17]
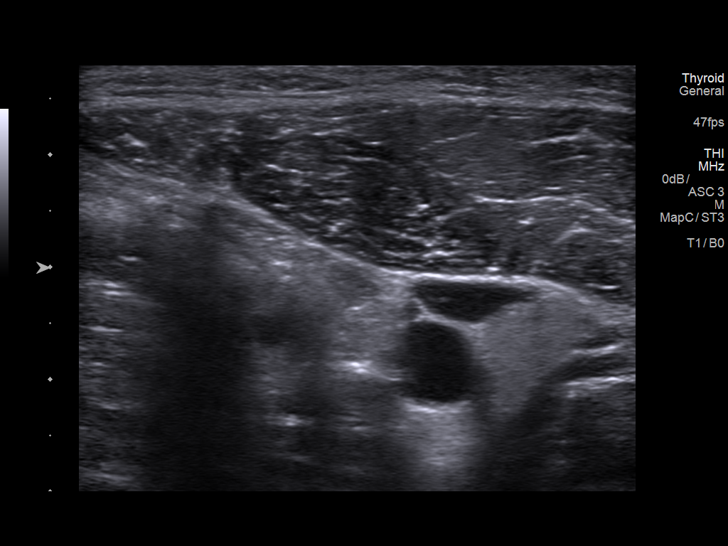
[im 7/17]
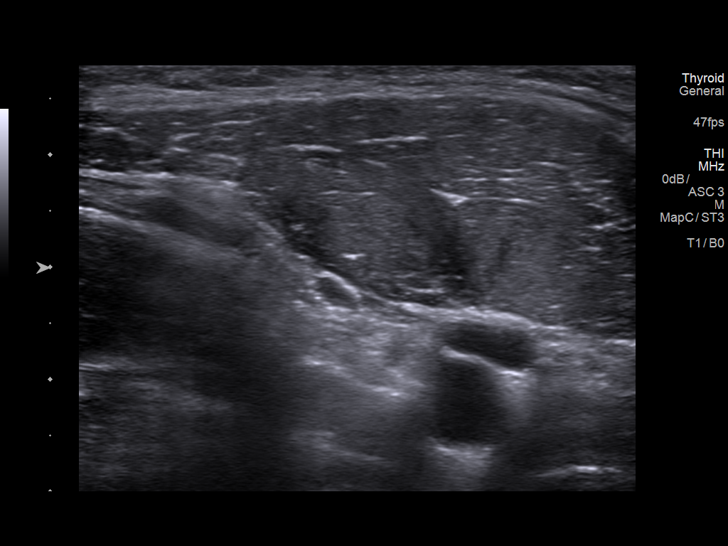
[im 8/17]
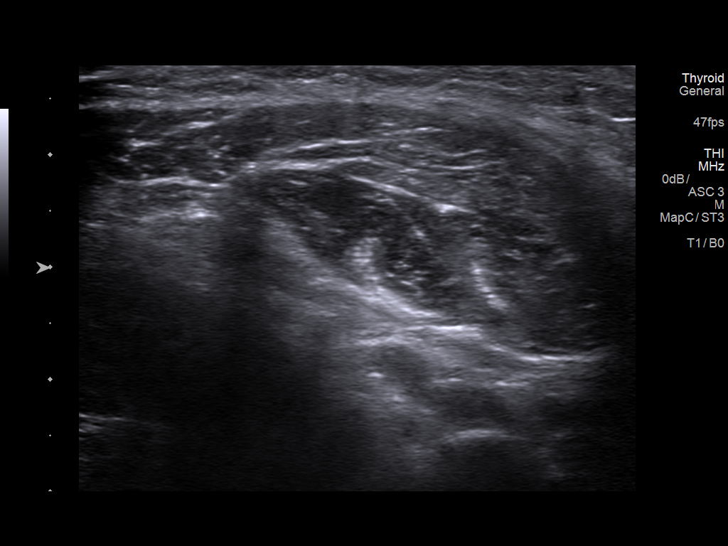
[im 10/17]
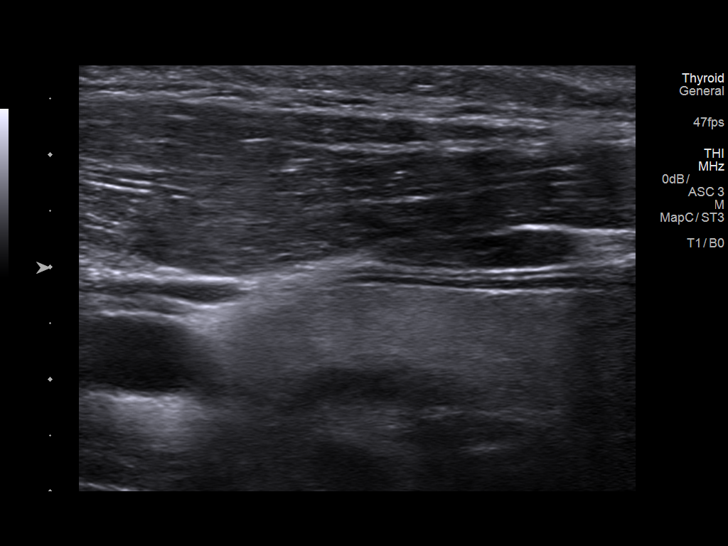
[im 11/17]
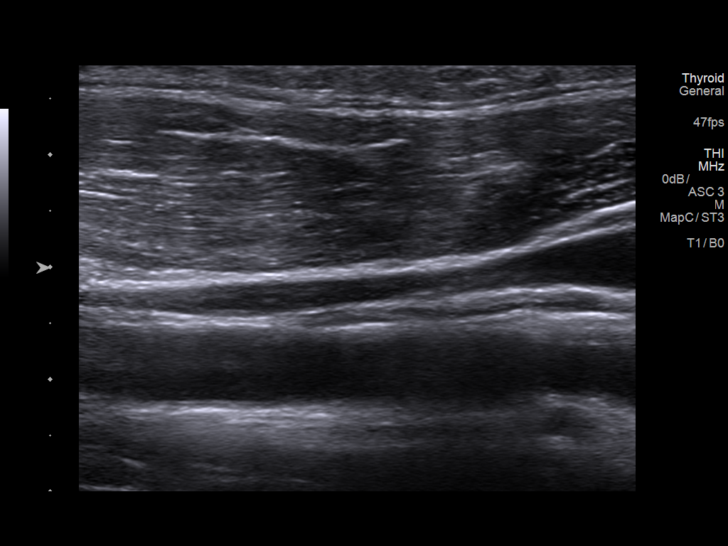
[im 12/17]
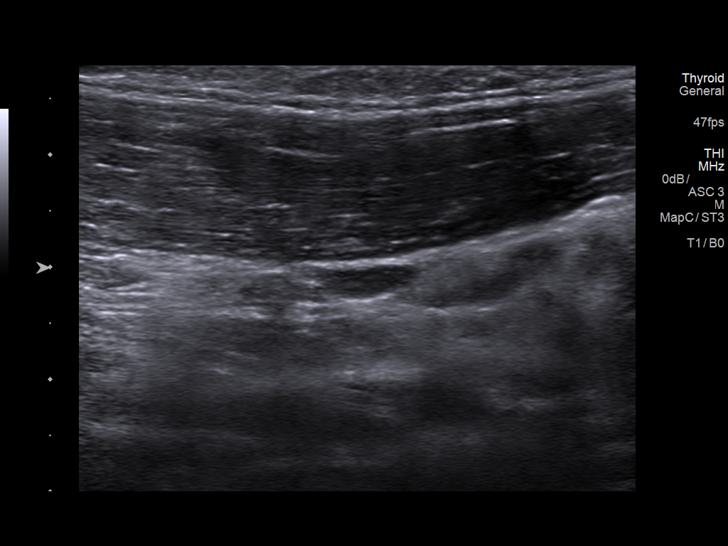
[im 13/17]
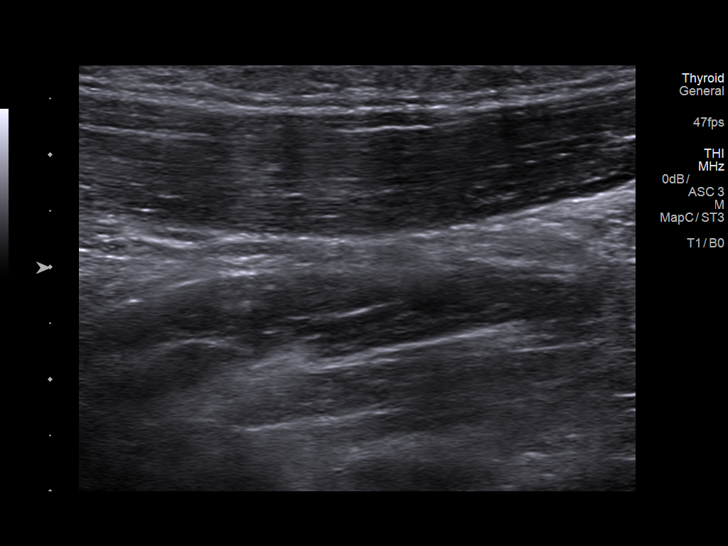
[im 14/17]
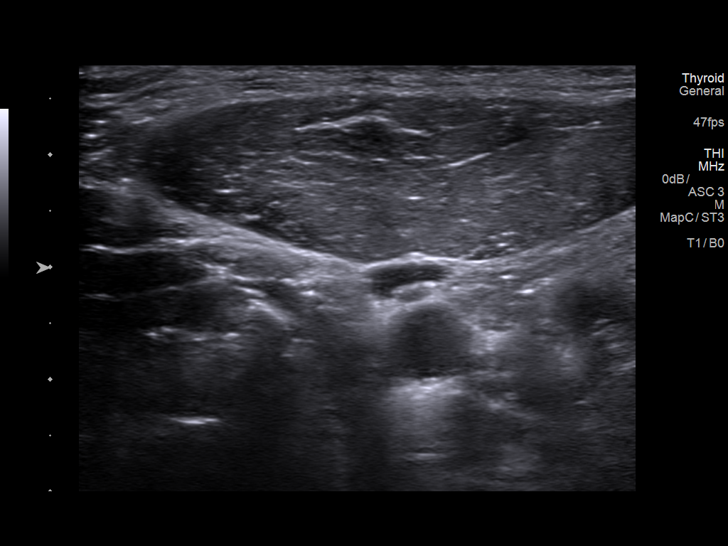
[im 16/17]
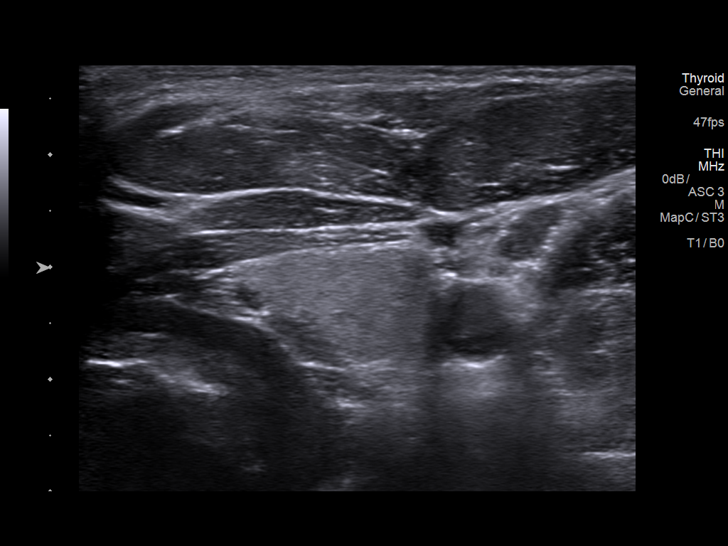
[im 17/17]
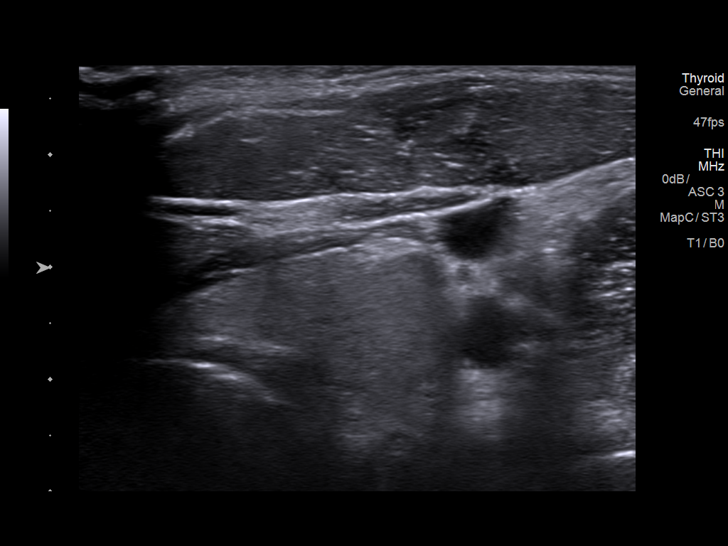

[14 of 17 positions shown; findings below may reference images not displayed]

FINDINGS: No adenopathy, mass, cyst, abscess, aneurysm, or other pathologic
correlate in the region of clinical concern. Limited contralateral
images are symmetric and unremarkable.
IMPRESSION: No ultrasound abnormality

## 2017-11-25 IMAGING — CT CT HEAD W/O CM
3 of 4 series · 14 of 47 positions shown, 16 images · non-contrast
Comparison: Head CT dated 04/15/2011

CLINICAL DATA: 25-year-old male with level 1 trauma.

EXAM:
CT HEAD WITHOUT CONTRAST
CT CERVICAL SPINE WITHOUT CONTRAST
TECHNIQUE: Multidetector CT imaging of the head and cervical spine was
performed following the standard protocol without intravenous
contrast. Multiplanar CT image reconstructions of the cervical spine
were also generated.

[Series 7: coronal bone · coronal · 0.30mm/px · 3 of 49 slices shown]
[im 17/49  brain]
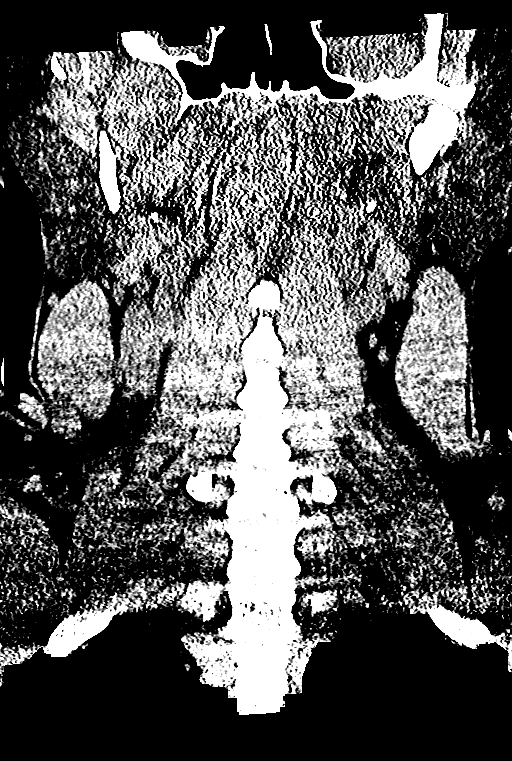
[im 22/49  brain]
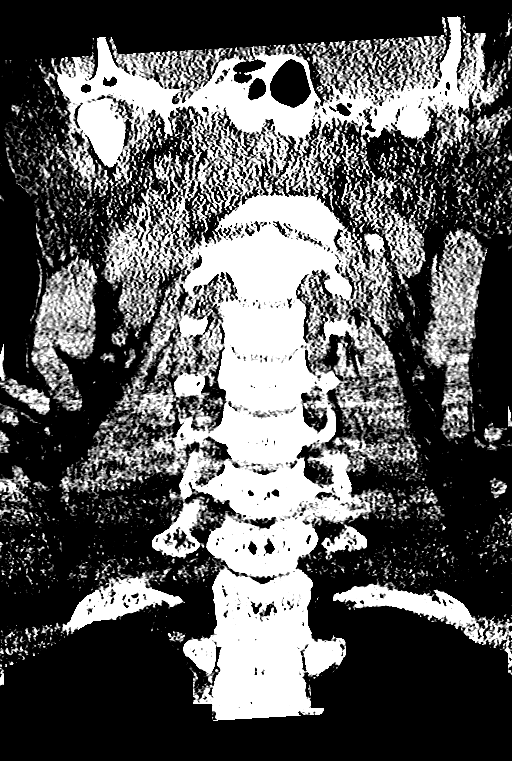
[im 27/49  brain]
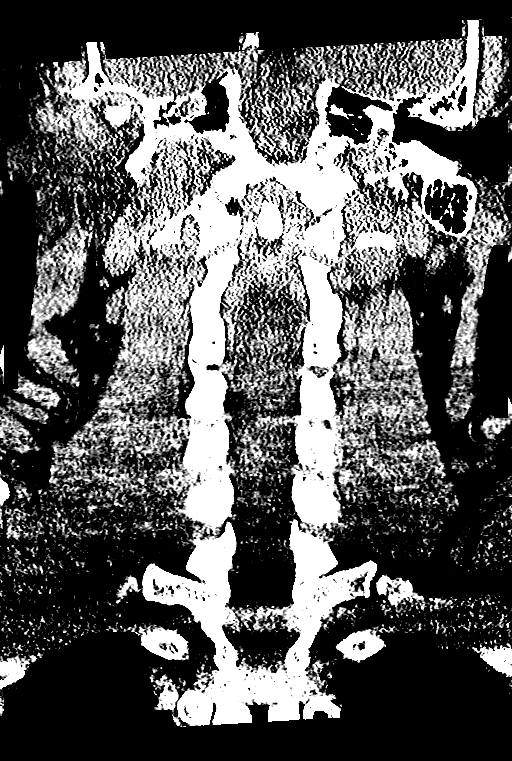

[Series 8: sagittal bone · sagittal · 0.30mm/px · 3 of 52 slices shown]
[im 18/52  brain]
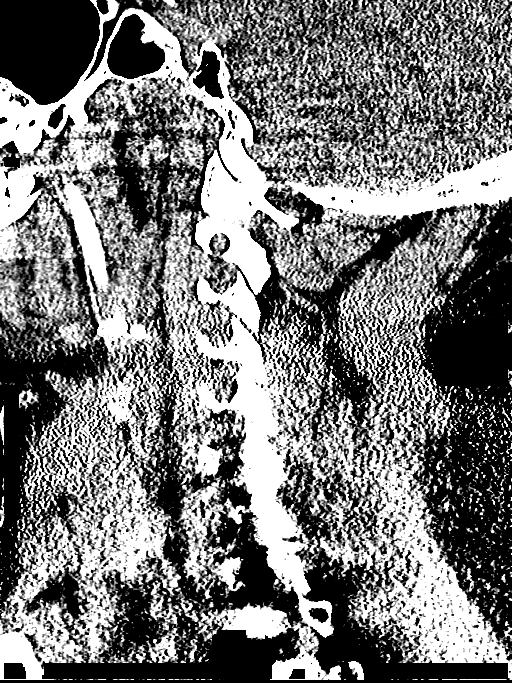
[im 26/52  brain]
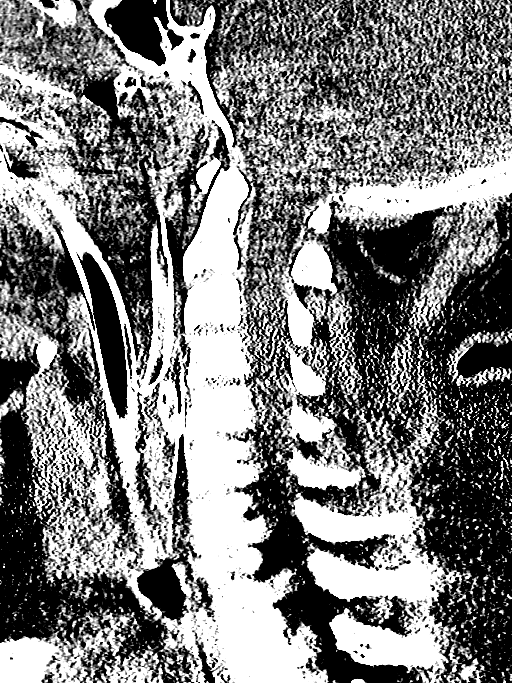
[im 35/52  brain]
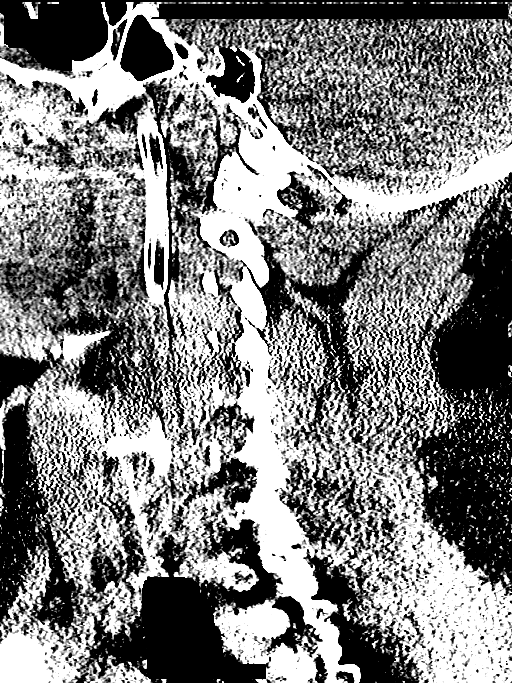

[Series 10: orthogonal axial st · axial · 0.21mm/px · z∈[-440,-290]mm · 8 of 90 slices shown, 10 images]
[im 7/90  brain]
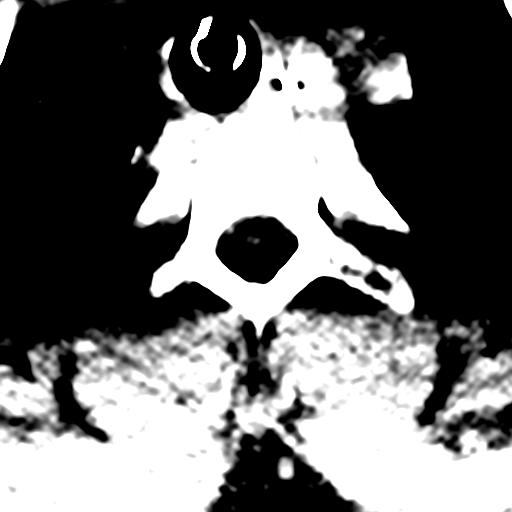
[im 7/90  bone]
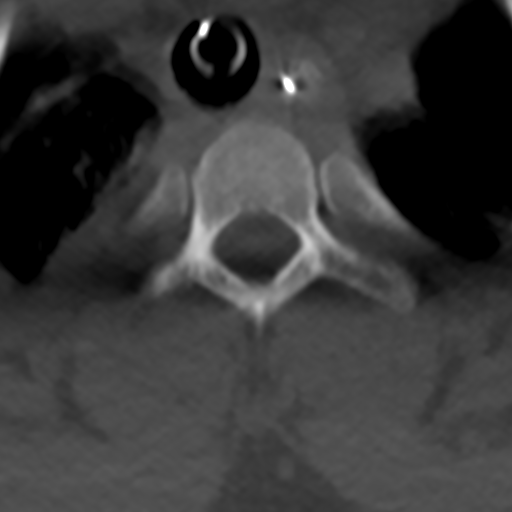
[im 21/90  brain]
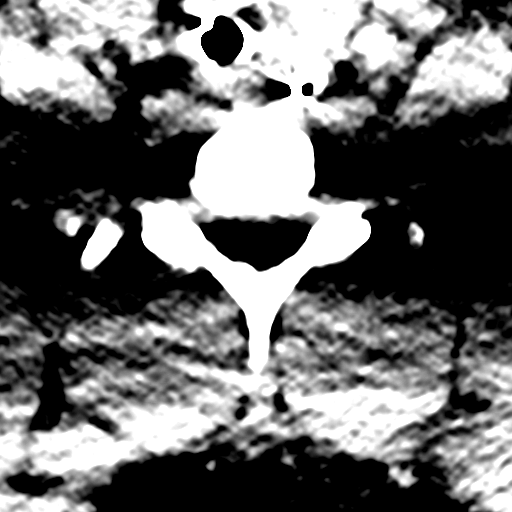
[im 28/90  brain]
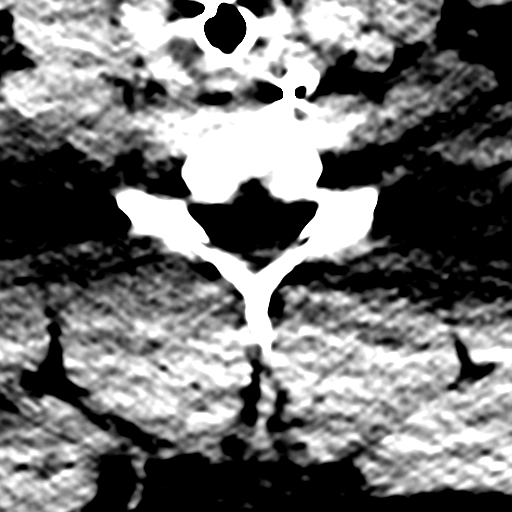
[im 42/90  brain]
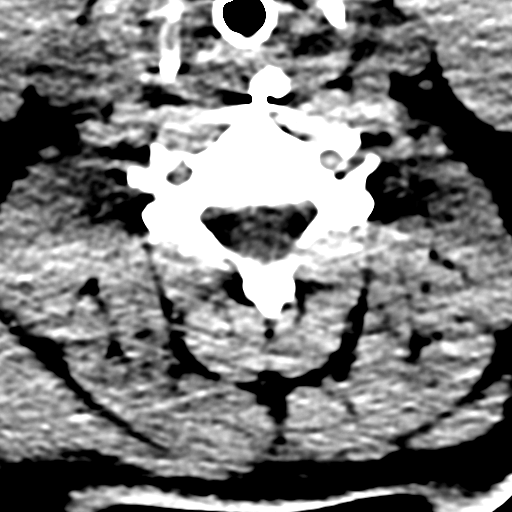
[im 48/90  brain]
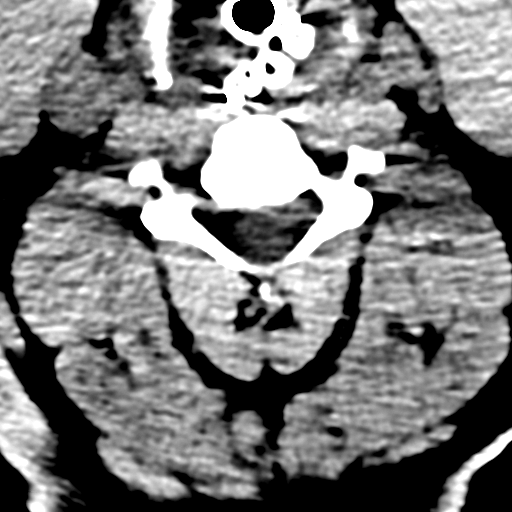
[im 48/90  bone]
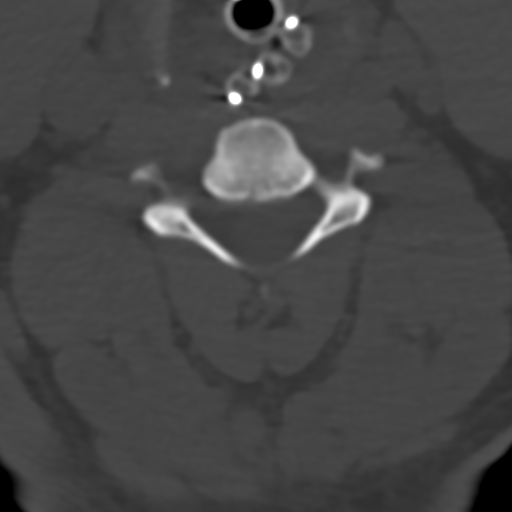
[im 62/90  brain]
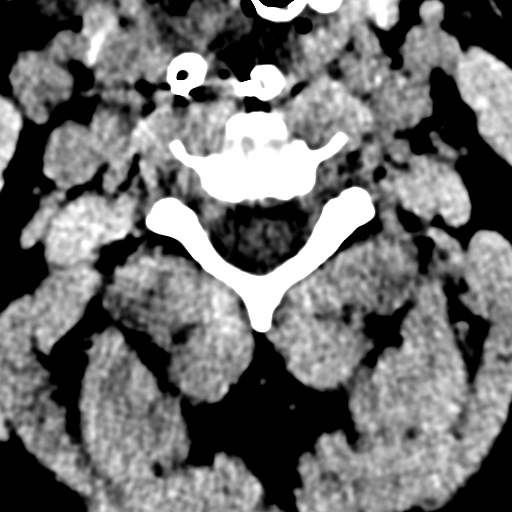
[im 69/90  brain]
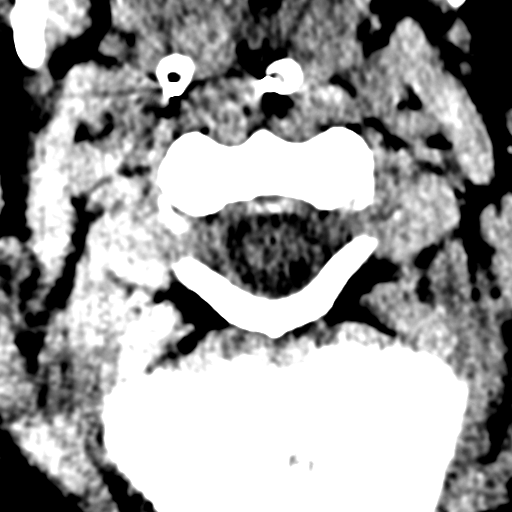
[im 83/90  brain]
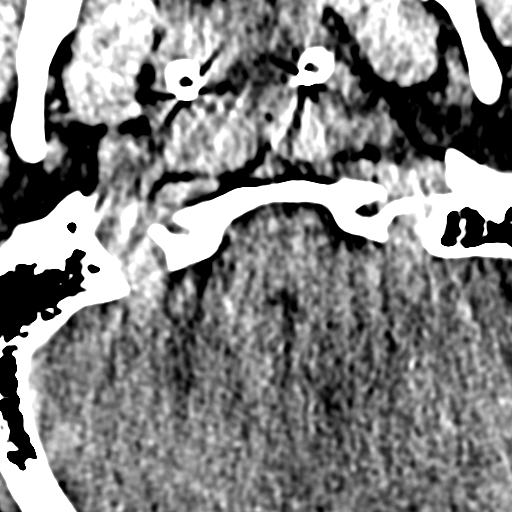

[14 of 47 positions shown; findings below may reference images not displayed]

FINDINGS: CT HEAD FINDINGS

Brain: No evidence of acute infarction, hemorrhage, hydrocephalus,
extra-axial collection or mass lesion/mass effect.

Vascular: There is high attenuation of the MCAs bilaterally likely
related to hemoconcentration.

Skull: Normal. Negative for fracture or focal lesion.

Sinuses/Orbits: There is opacification of the nasal passages as well
as partial opacification of the ethmoid air cells. No air-fluid
levels. The mastoid air cells are clear.

Other: An endotracheal tube is partially visualized. An enteric tube
is seen extending up into the posterior nasopharynx. The tube kinks
in the nasopharynx and extends down into the esophagus. Recommend
retraction and repositioning of the enteric tube.

CT CERVICAL SPINE FINDINGS

Alignment: Normal.

Skull base and vertebrae: No acute fracture. No primary bone lesion
or focal pathologic process.

Soft tissues and spinal canal: No prevertebral fluid or swelling. No
visible canal hematoma.

Disc levels:  No acute findings.

Upper chest: Biapical densities may represent atelectasis/ scarring
versus contusion.

Other: An endotracheal tube and enteric tube noted. The enteric tube
extends and kinks in the posterior nasopharynx under and extends
down into the esophagus. Recommend retraction and repositioning. The
tips of these tubes are not included in these images.
IMPRESSION: No acute intracranial hemorrhage.

No acute/ traumatic cervical spine pathology.

Enteric tube extends and kinks in the posterior nasopharynx and
extends down into the esophagus. Recommend retraction and
repositioning.

## 2017-11-27 IMAGING — CR DG CHEST 2V
2 series · 2 of 2 positions shown · non-contrast
Comparison: 03/18/2016

CLINICAL DATA: F/U Aspiration into airway. Fall x 3 days ago;
vomiting, alcohol abuse, chest pain from vomiting. Smoker @ .5ppd.

EXAM:
CHEST - 2 VIEW

[chest pa]
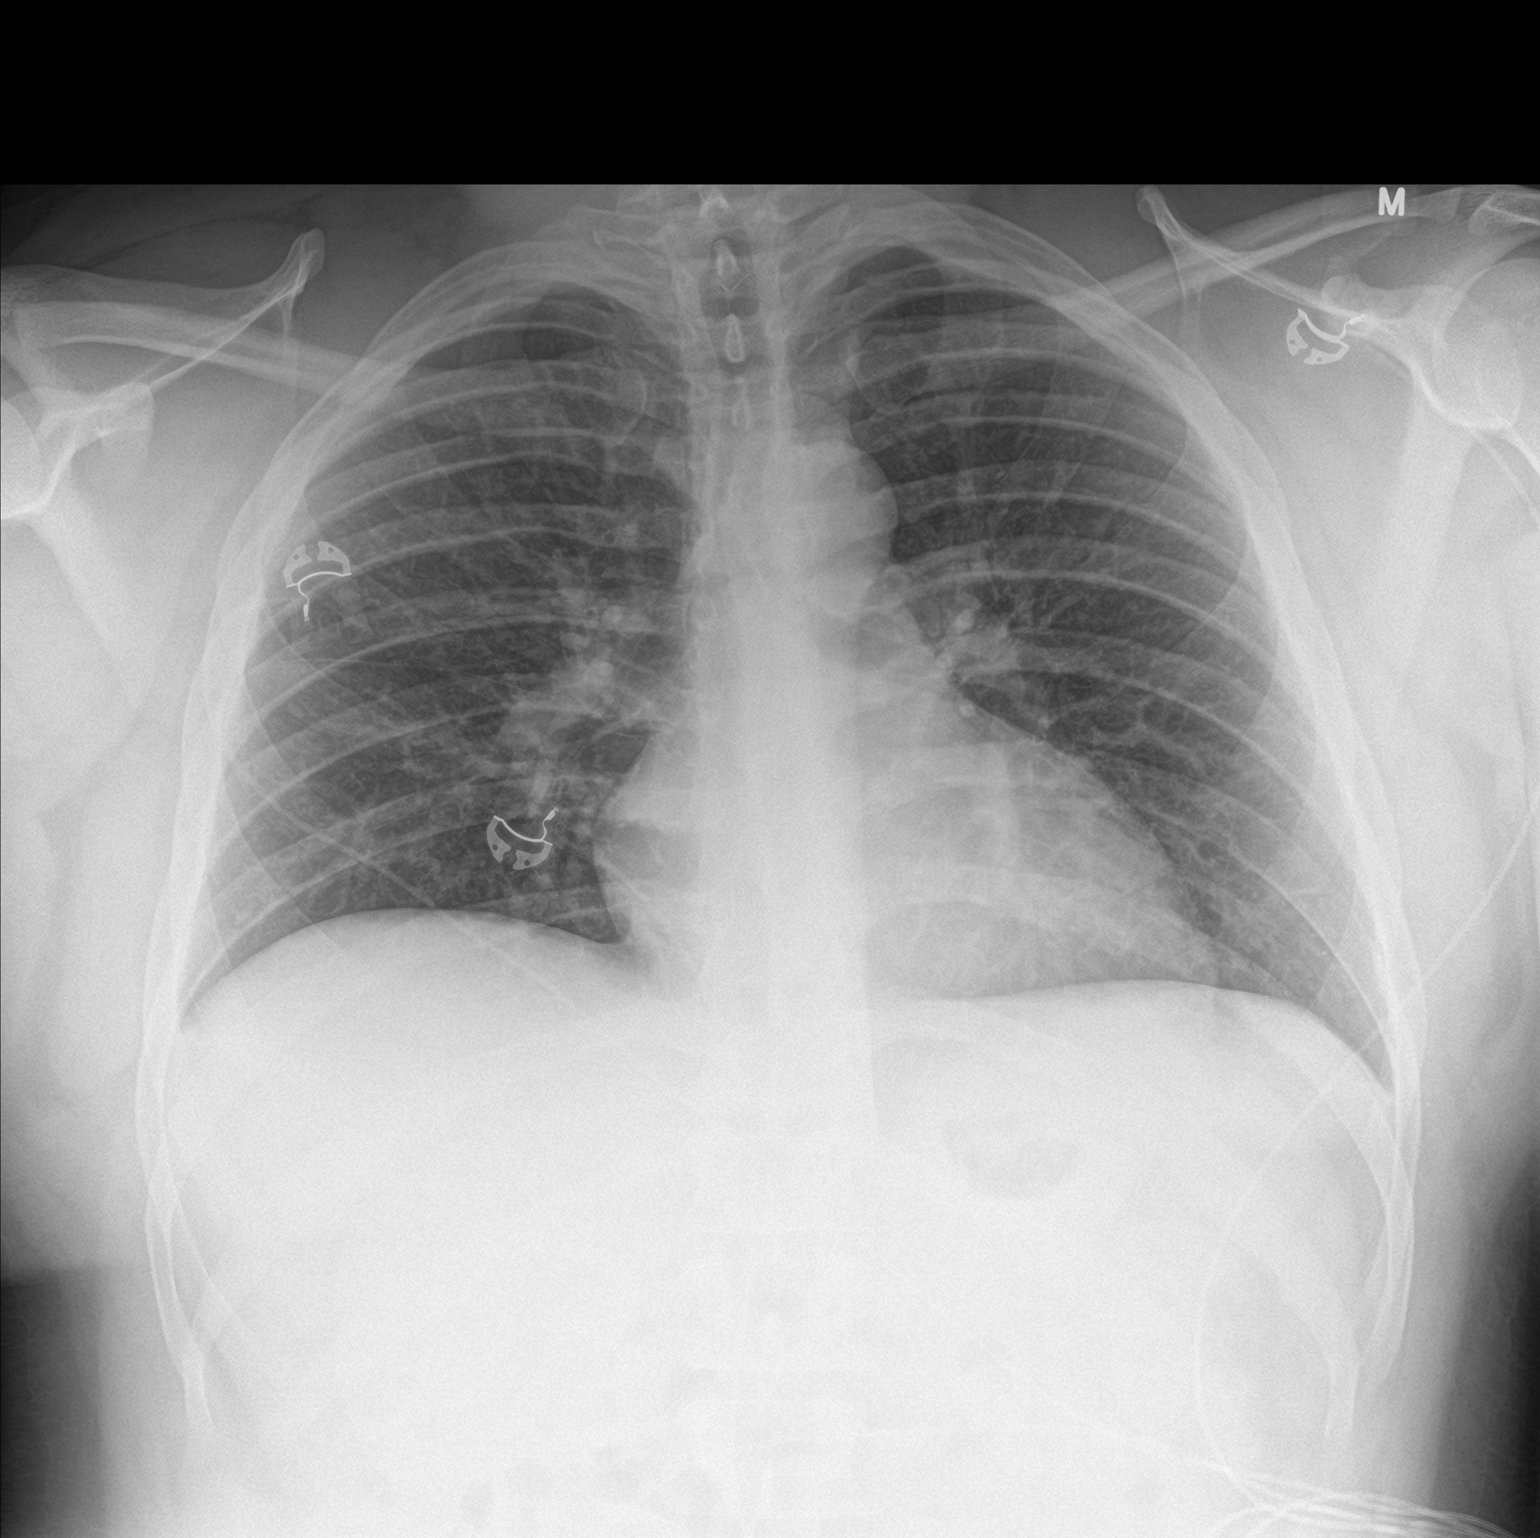

[chest lat]
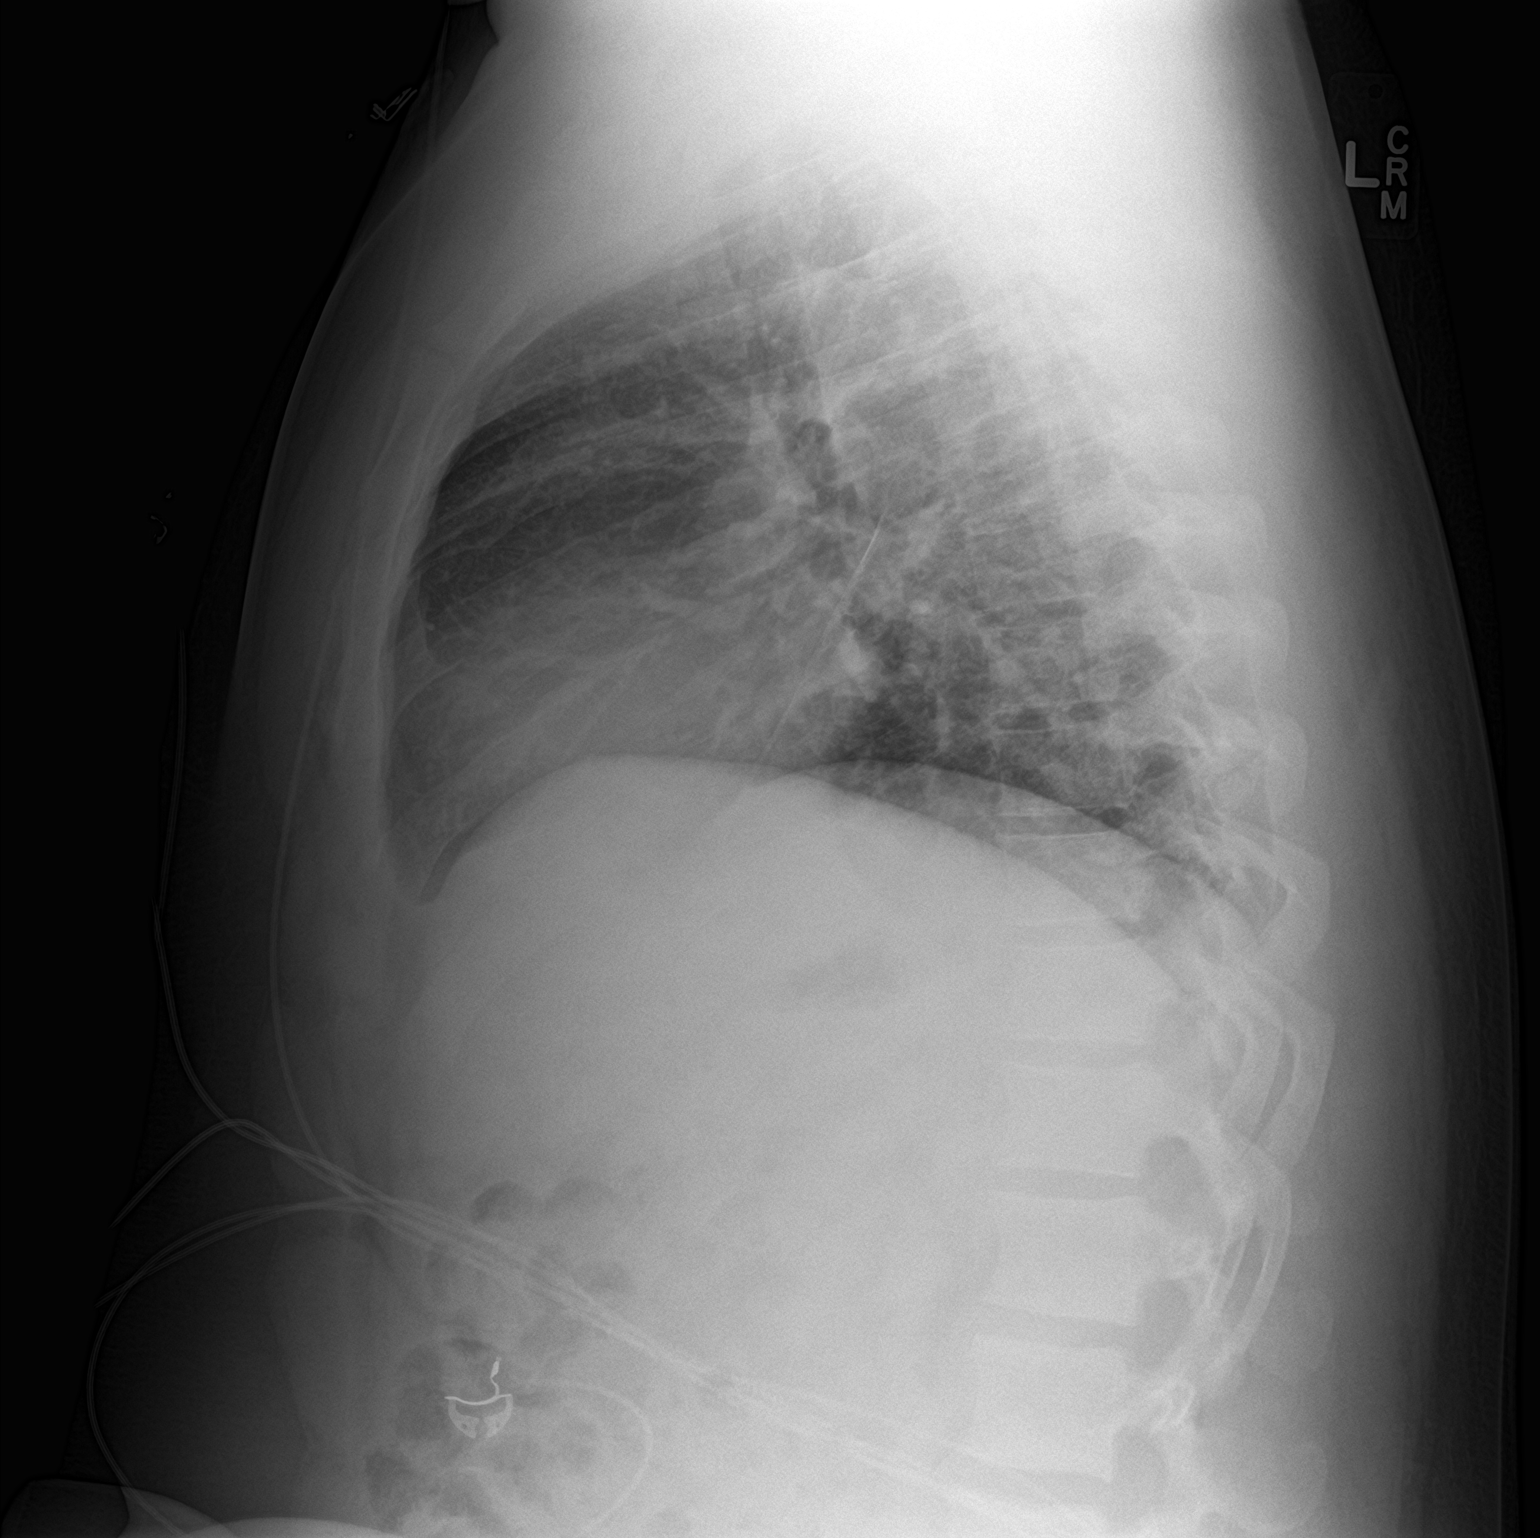

[2 of 2 positions shown; findings below may reference images not displayed]

FINDINGS: Patient has been extubated and the nasogastric tube removed. Lungs
are clear.

Heart size and mediastinal contours are within normal limits.

No effusion.

Visualized bones unremarkable.
IMPRESSION: No acute cardiopulmonary disease post extubation

## 2017-12-02 ENCOUNTER — Other Ambulatory Visit: Payer: Self-pay

## 2017-12-02 ENCOUNTER — Emergency Department
Admission: EM | Admit: 2017-12-02 | Discharge: 2017-12-02 | Disposition: A | Discharge status: Home / Self Care | Payer: Self-pay | Attending: Emergency Medicine | Admitting: Emergency Medicine

## 2017-12-02 DIAGNOSIS — Z79899 Other long term (current) drug therapy: Secondary | ICD-10-CM | POA: Insufficient documentation

## 2017-12-02 DIAGNOSIS — F17210 Nicotine dependence, cigarettes, uncomplicated: Secondary | ICD-10-CM | POA: Insufficient documentation

## 2017-12-02 DIAGNOSIS — G5642 Causalgia of left upper limb: Secondary | ICD-10-CM | POA: Insufficient documentation

## 2017-12-02 MED ORDER — METHYLPREDNISOLONE 4 MG PO TBPK: ORAL_TABLET | ORAL | 0 refills | Status: DC

## 2017-12-02 MED ORDER — METHYLPREDNISOLONE SODIUM SUCC 125 MG IJ SOLR
125.0000 mg | Freq: Once | INTRAMUSCULAR | Status: AC
  Administered 2017-12-02: 125 mg via INTRAMUSCULAR

## 2017-12-02 MED ORDER — METHYLPREDNISOLONE SODIUM SUCC 125 MG IJ SOLR
125.0000 mg | Freq: Once | INTRAMUSCULAR | Status: DC
  Filled 2017-12-02: qty 2

## 2017-12-02 NOTE — ED Provider Notes (Signed)
[facility] Emergency Department Provider Note   ____________________________________________   First MD Initiated Contact with Patient 12/02/17 1210     (approximate)  I have reviewed the triage vital signs and the nursing notes.   HISTORY  Chief Complaint Arm Pain    HPI Jason Collier is a 27 y.o. male patient presents for chronic left arm pain.  Patient states he was diagnosed with CRS.  Patient is undergone different modalities through orthopedics.  Patient state home medications are not working cannot see his treating doctor today.  Past Medical History:  Diagnosis Date  . Anxiety   . Environmental and seasonal allergies    can lead to brochitis  . Kidney infection 2015   history of    Patient Active Problem List   Diagnosis Date Noted  . 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  . Panic attacks 01/28/2017  . Benzodiazepine overdose 01/28/2017  . Acute hypoxemic respiratory failure (HCC) 03/19/2016  . Encounter for orogastric (OG) tube placement   . Aspiration into airway   . Neck mass     Past Surgical History:  Procedure Laterality Date  . KNEE ARTHROSCOPY WITH LATERAL MENISECTOMY Right 07/26/2015   Procedure: KNEE ARTHROSCOPY WITH LATERAL MENISECTOMY, DEBRIDEMENT OF FAT PAD;  Surgeon: Kennedy Bucker, MD;  Location: [facility] ORS;  Service: Orthopedics;  Laterality: Right;  . NO PAST SURGERIES      Prior to Admission medications   Medication Sig Start Date End Date Taking? Authorizing Provider  acetaminophen (TYLENOL) 325 MG tablet Take 325 mg by mouth every 6 (six) hours as needed for headache.    [provider]  albuterol-ipratropium (COMBIVENT) 18-103 MCG/ACT inhaler Inhale 1-2 puffs into the lungs every 6 (six) hours as needed for wheezing or shortness of breath.    [provider]  clonazePAM (KLONOPIN) 0.5 MG tablet Take 0.5 mg by mouth 2 (two) times daily as needed for anxiety.    [provider]  clonazePAM (KLONOPIN) 1 MG tablet Take 1 mg by mouth at bedtime as needed for anxiety.    [provider]  CLONIDINE HCL PO Take 1 tablet by mouth 2 (two) times daily as needed (anxiety). Reported on 07/26/2015    [provider]  cyclobenzaprine (FLEXERIL) 10 MG tablet Take 10 mg by mouth 3 (three) times daily as needed for muscle spasms. Reported on 07/26/2015    [provider]  FLUoxetine (PROZAC) 20 MG tablet Take 20 mg by mouth daily.    [provider]  gabapentin (NEURONTIN) 300 MG capsule Take 300 mg by mouth 3 (three) times daily.    [provider]  HYDROcodone-acetaminophen (NORCO) 5-325 MG tablet Take 1 tablet by mouth every 6 (six) hours as needed for moderate pain. 07/26/15   Kennedy Bucker, MD  ibuprofen (ADVIL,MOTRIN) 200 MG tablet Take 200-800 mg by mouth every 6 (six) hours as needed for headache.    [provider]  ibuprofen (ADVIL,MOTRIN) 800 MG tablet Take 200 mg by mouth every 8 (eight) hours as needed for moderate pain.    [provider]  meclizine (ANTIVERT) 25 MG tablet Take 25 mg by mouth 3 (three) times daily as needed for dizziness.    [provider]  meloxicam (MOBIC) 15 MG tablet Take 15 mg by mouth daily.    [provider]  methylPREDNISolone (MEDROL DOSEPAK) 4 MG TBPK tablet Take Tapered dose as directed 12/02/17   Joni Reining, PA-C  ondansetron (ZOFRAN ODT)  4 MG disintegrating tablet Take 1 tablet (4 mg total) by mouth every 8 (eight) hours as needed for nausea or vomiting. 12/14/15   Gayla Doss, MD  oxyCODONE (ROXICODONE) 5 MG immediate release tablet 1-2 tabs every 6 hours as needed for severe pain. Do not drive or operate machinery while on this medication. Use stool softeners as medication may cause constipation. 07/27/15   Maurilio Lovely, MD  predniSONE (DELTASONE) 10 MG tablet Take 1 tablet (10 mg total) by mouth daily. 6,5,4,3,2,1 six day taper 12/21/16    Evon Slack, PA-C  QUEtiapine (SEROQUEL) 50 MG tablet Take 2 tablets (100 mg total) by mouth at bedtime. Take half pill up to twice a day as needed for anxiety.  Take 2 pills regularly every night for sleep and mood. 01/28/17   Clapacs, Jackquline Denmark, MD  sertraline (ZOLOFT) 50 MG tablet Take 50 mg by mouth daily. Reported on 07/26/2015    [provider]    Allergies Tramadol; Hydrocodone; Hydrocodone; and Tramadol  No family history on file.  Social History Social History   Tobacco Use  . Smoking status: Current Every Day Smoker    Packs/day: 0.50    Types: Cigarettes  . Smokeless tobacco: Former Engineer, water Use Topics  . Alcohol use: Yes    Alcohol/week: 2.0 standard drinks    Types: 2 Cans of beer per week    Comment: every three days  . Drug use: No    Types: Marijuana    Comment: over 1 year ago    Review of Systems Constitutional: No fever/chills Eyes: No visual changes. ENT: No sore throat. Cardiovascular: Denies chest pain. Respiratory: Denies shortness of breath. Gastrointestinal: No abdominal pain.  No nausea, no vomiting.  No diarrhea.  No constipation. Genitourinary: Negative for dysuria. Musculoskeletal: Negative for back pain. Skin: Negative for rash. Neurological: Chronic pain left arm.  Psychiatric:Anxiety.  Allergic/Immunilogical: See medication list. ____________________________________________   PHYSICAL EXAM:  VITAL SIGNS: ED Triage Vitals  Enc Vitals Group     BP 12/02/17 1200 (!) 152/99     Pulse Rate 12/02/17 1200 88     Resp 12/02/17 1200 20     Temp 12/02/17 1200 98.5 F (36.9 C)     Temp Source 12/02/17 1200 Oral     SpO2 12/02/17 1200 95 %     Weight 12/02/17 1200 295 lb (133.8 kg)     Height 12/02/17 1200 5\' 11"  (1.803 m)     Head Circumference --      Peak Flow --      Pain Score 12/02/17 1202 9     Pain Loc --      Pain Edu? --      Excl. in [place]? --     Constitutional: Alert and oriented. Well appearing and in  no acute distress. Cardiovascular: Normal rate, regular rhythm. Grossly normal heart sounds.  Good peripheral circulation. Respiratory: Normal respiratory effort.  No retractions. Lungs CTAB. Musculoskeletal: No obvious deformity to the left upper arm.  Patient has full neck range of motion.. Neurologic:  Normal speech and language. No gross focal neurologic deficits are appreciated. No gait instability. Skin:  Skin is warm, dry and intact. No rash noted. Psychiatric: Mood and affect are normal. Speech and behavior are normal.  ____________________________________________   LABS (all labs ordered are listed, but only abnormal results are displayed)  Labs Reviewed - No data to display ____________________________________________  EKG   ____________________________________________  RADIOLOGY  ED MD interpretation:  Official radiology report(s): No results found.  ____________________________________________   PROCEDURES  Procedure(s) performed: None  Procedures  Critical Care performed: No  ____________________________________________   INITIAL IMPRESSION / ASSESSMENT AND PLAN / ED COURSE  As part of my medical decision making, I reviewed the following data within the electronic MEDICAL RECORD NUMBER    CRS involving the left upper extremity.  Patient given discharge care instruction.  Patient advised to follow-up with treating doctor for continued care.  Patient given prescription for Medrol Dosepak.      ____________________________________________   FINAL CLINICAL IMPRESSION(S) / ED DIAGNOSES  Final diagnoses:  Complex regional pain syndrome type 2 of left upper extremity     ED Discharge Orders         Ordered    methylPREDNISolone (MEDROL DOSEPAK) 4 MG TBPK tablet     12/02/17 1219           Note:  This document was prepared using Dragon voice recognition software and may include unintentional dictation errors.    Joni Reining,  PA-C 12/02/17 1225    Emily Filbert, MD 12/02/17 [phone]

## 2017-12-02 NOTE — ED Triage Notes (Signed)
Pt hx of left arm pain since work related injury. Spasms, burning and aching pain X 2 days. Usually occurs X 2 a month and patient treats pain at home. unable to treat pain at this this occurrence.  Pt alert and oriented X4, active, cooperative, pt in NAD. RR even and unlabored, color WNL.

## 2017-12-02 NOTE — Discharge Instructions (Addendum)
Follow-up with treating doctor for continued care. °

## 2017-12-02 NOTE — ED Triage Notes (Signed)
FIRST NURSE NOTE-hx RPS and having burning pain in left arm that is same as when has flare. Pain meds at home not working.

## 2017-12-02 NOTE — ED Notes (Addendum)
See triage note. Pt states he has "flair up of CRPS" s/p previous injury to L arm. Pt co pain and burning from L elbow down. Pt states he has been trying to control with pain medication at home but it has not been working.  1214 Smith PA at bedside

## 2019-08-23 ENCOUNTER — Ambulatory Visit
Admission: EM | Admit: 2019-08-23 | Discharge: 2019-08-23 | Disposition: A | Discharge status: Home / Self Care | Payer: Medicaid Other

## 2019-08-23 ENCOUNTER — Other Ambulatory Visit: Payer: Self-pay

## 2019-08-23 ENCOUNTER — Encounter: Payer: Self-pay | Admitting: Gynecology

## 2019-08-23 ENCOUNTER — Ambulatory Visit (INDEPENDENT_AMBULATORY_CARE_PROVIDER_SITE_OTHER): Payer: Medicaid Other

## 2019-08-23 DIAGNOSIS — S93601A Unspecified sprain of right foot, initial encounter: Secondary | ICD-10-CM | POA: Diagnosis not present

## 2019-08-23 NOTE — Discharge Instructions (Signed)
Apply ice 20 minutes out of every 2 hours 4-5 times daily for comfort.  Elevate your foot and leg sufficiently to control swelling and pain.  Try to limit your ambulation.  When you do ambulate wear the boot.  Schedule an appointment to see podiatry as soon as possible.

## 2019-08-23 NOTE — ED Provider Notes (Signed)
[facility]    CSN: [id number] Arrival date & time: 08/23/19  1127      History   Chief Complaint Chief Complaint  Patient presents with  . Foot Injury    HPI Jason Collier is a 29 y.o. male.   HPI  29 year old male presents with an injury to his right foot after he was starting a sprint to run with his kids about 2 days ago.  He states that when he first started off running he felt pop and then a severe burning stabbing pain in his heel and plantar surface of his foot.  He states that since that time the burning and stabbing and aching has continued.  It is very tender to the touch.  States it is difficult to walk on.      Past Medical History:  Diagnosis Date  . Anxiety   . Environmental and seasonal allergies    can lead to brochitis  . Kidney infection 2015   history of    Patient Active Problem List   Diagnosis Date Noted  . 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  . Panic attacks 01/28/2017  . Benzodiazepine overdose 01/28/2017  . Acute hypoxemic respiratory failure (HCC) 03/19/2016  . Encounter for orogastric (OG) tube placement   . Aspiration into airway   . Neck mass     Past Surgical History:  Procedure Laterality Date  . KNEE ARTHROSCOPY WITH LATERAL MENISECTOMY Right 07/26/2015   Procedure: KNEE ARTHROSCOPY WITH LATERAL MENISECTOMY, DEBRIDEMENT OF FAT PAD;  Surgeon: Kennedy Bucker, MD;  Location: [facility] ORS;  Service: Orthopedics;  Laterality: Right;  . NO PAST SURGERIES         Home Medications    Prior to Admission medications   Medication Sig Start Date End Date Taking? Authorizing Provider  acetaminophen (TYLENOL) 325 MG tablet Take 325 mg by mouth every 6 (six) hours as needed for headache.   Yes [provider]  divalproex (DEPAKOTE) 500 MG DR tablet 2 po in am and 2 po in pm 06/12/18  Yes [provider]  clonazePAM (KLONOPIN) 1 MG tablet Take 1 mg by mouth at bedtime as needed for  anxiety.    [provider]  ibuprofen (ADVIL,MOTRIN) 800 MG tablet Take 200 mg by mouth every 8 (eight) hours as needed for moderate pain.    [provider]  oxyCODONE (ROXICODONE) 5 MG immediate release tablet 1-2 tabs every 6 hours as needed for severe pain. Do not drive or operate machinery while on this medication. Use stool softeners as medication may cause constipation. 07/27/15   Maurilio Lovely, MD  albuterol-ipratropium (COMBIVENT) 18-103 MCG/ACT inhaler Inhale 1-2 puffs into the lungs every 6 (six) hours as needed for wheezing or shortness of breath.  08/23/19  [provider]  CLONIDINE HCL PO Take 1 tablet by mouth 2 (two) times daily as needed (anxiety). Reported on 07/26/2015  08/23/19  [provider]  FLUoxetine (PROZAC) 20 MG tablet Take 20 mg by mouth daily.  08/23/19  [provider]  gabapentin (NEURONTIN) 300 MG capsule Take 300 mg by mouth 3 (three) times daily.  08/23/19  [provider]  QUEtiapine (SEROQUEL) 50 MG tablet Take 2 tablets (100 mg total) by mouth at bedtime. Take half pill up to twice a day as needed for anxiety.  Take 2 pills regularly every night for sleep and mood. 01/28/17 08/23/19  Clapacs, Jackquline Denmark, MD  sertraline (ZOLOFT) 50 MG tablet Take 50 mg by  mouth daily. Reported on 07/26/2015  08/23/19  [provider]    Family History Family History  Problem Relation Age of Onset  . Anxiety disorder Mother     Social History Social History   Tobacco Use  . Smoking status: Current Every Day Smoker    Packs/day: 0.50    Types: Cigarettes  . Smokeless tobacco: Former Network engineer Use Topics  . Alcohol use: Yes    Alcohol/week: 2.0 standard drinks    Types: 2 Cans of beer per week    Comment: every three days  . Drug use: No    Types: Marijuana    Comment: over 1 year ago     Allergies   Tramadol, Hydrocodone, Hydrocodone, and Tramadol   Review of Systems Review of Systems    Constitutional: Positive for activity change. Negative for appetite change, chills, diaphoresis, fatigue and fever.  Musculoskeletal: Positive for gait problem and myalgias.  All other systems reviewed and are negative.    Physical Exam Triage Vital Signs ED Triage Vitals  Enc Vitals Group     BP 08/23/19 1147 (!) 161/135     Pulse Rate 08/23/19 1147 87     Resp 08/23/19 1147 18     Temp 08/23/19 1147 98.7 F (37.1 C)     Temp Source 08/23/19 1147 Oral     SpO2 08/23/19 1147 97 %     Weight 08/23/19 1147 290 lb (131.5 kg)     Height 08/23/19 1147 5\' 11"  (1.803 m)     Head Circumference --      Peak Flow --      Pain Score 08/23/19 1145 7     Pain Loc --      Pain Edu? --      Excl. in [place]? --    No data found.  Updated Vital Signs BP 140/90 (BP Location: Left Arm)   Pulse 87   Temp 98.7 F (37.1 C) (Oral)   Resp 18   Ht 5\' 11"  (1.803 m)   Wt 290 lb (131.5 kg)   SpO2 97%   BMI 40.45 kg/m   Visual Acuity Right Eye Distance:   Left Eye Distance:   Bilateral Distance:    Right Eye Near:   Left Eye Near:    Bilateral Near:     Physical Exam Vitals and nursing note reviewed.  Constitutional:      General: He is not in acute distress.    Appearance: Normal appearance. He is not ill-appearing or toxic-appearing.  HENT:     Head: Normocephalic and atraumatic.  Eyes:     Conjunctiva/sclera: Conjunctivae normal.  Musculoskeletal:        General: Swelling, tenderness and signs of injury present.     Cervical back: Normal range of motion and neck supple.     Comments: Examination of the right foot shows mild swelling in comparison to the left.  Normal capillary refill.  Patient has a hip esthesia to light touch of the distal foot.  Range of motion is limited by discomfort with plantar flexion to 15 degrees dorsiflexion to 10 degrees.  He has a negative Thompson test.  Is very tender generally but particularly over the calcaneus plantar Lee and plantar fascia as well  as the insertion of the Achilles.  Skin:    General: Skin is warm and dry.  Neurological:     General: No focal deficit present.     Mental Status: He is alert and oriented  to person, place, and time.  Psychiatric:        Mood and Affect: Mood normal.        Behavior: Behavior normal.        Thought Content: Thought content normal.        Judgment: Judgment normal.      UC Treatments / Results  Labs (all labs ordered are listed, but only abnormal results are displayed) Labs Reviewed - No data to display  EKG   Radiology DG Foot Complete Right  Result Date: 08/23/2019 CLINICAL DATA:  Heel/plantar pain after running. EXAM: RIGHT FOOT COMPLETE - 3+ VIEW COMPARISON:  None. FINDINGS: There is no evidence of fracture or dislocation. There is no evidence of arthropathy or other focal bone abnormality. Soft tissues are unremarkable. IMPRESSION: Negative. Electronically Signed   By: Gerome Sam III M.D   On: 08/23/2019 12:53    Procedures Procedures (including critical care time)  Medications Ordered in UC Medications - No data to display  Initial Impression / Assessment and Plan / UC Course  I have reviewed the triage vital signs and the nursing notes.  Pertinent labs & imaging results that were available during my care of the patient were reviewed by me and considered in my medical decision making (see chart for details).   29 year old male presents with right foot and heel pain.  Days ago he was racing with his children and on the first 2 steps of the Sprint felt a severe pain in his plantar surface of his foot with a pop and then subsequent swelling with numbness and tingling.  Examination showed general tenderness with maximal tenderness at the insertion of the Achilles tendon on the calcaneus and also of the plantar area of the calcaneus and extending into the plantar fascia of the instep.  X-rays showed no fracture or dislocation.  I have discussed with the patient my  examination results and those of the x-ray.  Told him this is very likely ligamentous damage.  I will place him into a orthotic boot have him elevate and ice sufficiently to control swelling and pain.  Have also recommended that he be seen by podiatry for further evaluation and treatment perhaps MRI if indicated.  Will take ibuprofen in the meantime for pain.   Final Clinical Impressions(s) / UC Diagnoses   Final diagnoses:  Sprain of right foot, initial encounter     Discharge Instructions     Apply ice 20 minutes out of every 2 hours 4-5 times daily for comfort.  Elevate your foot and leg sufficiently to control swelling and pain.  Try to limit your ambulation.  When you do ambulate wear the boot.  Schedule an appointment to see podiatry as soon as possible.    ED Prescriptions    None     PDMP not reviewed this encounter.   Lutricia Feil, PA-C 08/23/19 1551

## 2019-08-23 NOTE — ED Triage Notes (Signed)
Per patient while running x 2 days ago injury his right foot. Per patient right foot with burning/ stabbing and aching.

## 2020-02-11 ENCOUNTER — Ambulatory Visit
Admission: EM | Admit: 2020-02-11 | Discharge: 2020-02-11 | Disposition: A | Discharge status: Home / Self Care | Payer: Medicaid Other | Attending: Family Medicine | Admitting: Family Medicine

## 2020-02-11 ENCOUNTER — Other Ambulatory Visit: Payer: Self-pay

## 2020-02-11 ENCOUNTER — Ambulatory Visit (INDEPENDENT_AMBULATORY_CARE_PROVIDER_SITE_OTHER)
Admit: 2020-02-11 | Discharge: 2020-02-11 | Disposition: A | Discharge status: Home / Self Care | Payer: Medicaid Other | Attending: Family Medicine | Admitting: Family Medicine

## 2020-02-11 DIAGNOSIS — R1084 Generalized abdominal pain: Secondary | ICD-10-CM | POA: Diagnosis present

## 2020-02-11 DIAGNOSIS — R1030 Lower abdominal pain, unspecified: Secondary | ICD-10-CM | POA: Diagnosis not present

## 2020-02-11 DIAGNOSIS — K760 Fatty (change of) liver, not elsewhere classified: Secondary | ICD-10-CM | POA: Diagnosis present

## 2020-02-11 HISTORY — DX: Complex regional pain syndrome I, unspecified: G90.50

## 2020-02-11 LAB — CBC WITH DIFFERENTIAL/PLATELET
Abs Immature Granulocytes: 0.02 10*3/uL (ref 0.00–0.07)
Basophils Absolute: 0 10*3/uL (ref 0.0–0.1)
Basophils Relative: 0 %
Eosinophils Absolute: 0.1 10*3/uL (ref 0.0–0.5)
Eosinophils Relative: 2 %
HCT: 47.5 % (ref 39.0–52.0)
Hemoglobin: 15.8 g/dL (ref 13.0–17.0)
Immature Granulocytes: 0 %
Lymphocytes Relative: 38 %
Lymphs Abs: 2.6 10*3/uL (ref 0.7–4.0)
MCH: 27.4 pg (ref 26.0–34.0)
MCHC: 33.3 g/dL (ref 30.0–36.0)
MCV: 82.5 fL (ref 80.0–100.0)
Monocytes Absolute: 0.5 10*3/uL (ref 0.1–1.0)
Monocytes Relative: 7 %
Neutro Abs: 3.7 10*3/uL (ref 1.7–7.7)
Neutrophils Relative %: 53 %
Platelets: 265 10*3/uL (ref 150–400)
RBC: 5.76 MIL/uL (ref 4.22–5.81)
RDW: 14.7 % (ref 11.5–15.5)
WBC: 7 10*3/uL (ref 4.0–10.5)
nRBC: 0 % (ref 0.0–0.2)

## 2020-02-11 LAB — COMPREHENSIVE METABOLIC PANEL
ALT: 56 U/L — ABNORMAL HIGH (ref 0–44)
AST: 35 U/L (ref 15–41)
Albumin: 4.4 g/dL (ref 3.5–5.0)
Alkaline Phosphatase: 56 U/L (ref 38–126)
Anion gap: 8 (ref 5–15)
BUN: 8 mg/dL (ref 6–20)
CO2: 27 mmol/L (ref 22–32)
Calcium: 9.1 mg/dL (ref 8.9–10.3)
Chloride: 101 mmol/L (ref 98–111)
Creatinine, Ser: 0.92 mg/dL (ref 0.61–1.24)
GFR, Estimated: >60 mL/min (ref >60)
Glucose, Bld: 90 mg/dL (ref 70–99)
Potassium: 4.1 mmol/L (ref 3.5–5.1)
Sodium: 136 mmol/L (ref 135–145)
Total Bilirubin: 0.6 mg/dL (ref 0.3–1.2)
Total Protein: 7.8 g/dL (ref 6.5–8.1)

## 2020-02-11 LAB — LIPASE, BLOOD: Lipase: 21 U/L (ref 11–51)

## 2020-02-11 MED ORDER — DICYCLOMINE HCL 20 MG PO TABS: 20.0000 mg | ORAL_TABLET | Freq: Four times a day (QID) | ORAL | 0 refills | Status: DC | PRN

## 2020-02-11 MED ORDER — IOHEXOL 300 MG/ML  SOLN
100.0000 mL | Freq: Once | INTRAMUSCULAR | Status: AC | PRN
  Administered 2020-02-11: 100 mL via INTRAVENOUS

## 2020-02-11 MED ORDER — ONDANSETRON 4 MG PO TBDP: 4.0000 mg | ORAL_TABLET | Freq: Three times a day (TID) | ORAL | 0 refills | Status: DC | PRN

## 2020-02-11 MED ORDER — KETOROLAC TROMETHAMINE 10 MG PO TABS: 10.0000 mg | ORAL_TABLET | Freq: Four times a day (QID) | ORAL | 0 refills | Status: DC | PRN

## 2020-02-11 NOTE — ED Triage Notes (Signed)
Patient complains of severe abdominal pain in lower abdomen. Patient states that pain has worsened in the last few hours, originally started 2 days ago. States that he has had diarrhea yesterday and had some nausea and vomiting yesterday as well. Patient guarded with walking back to exam room.

## 2020-02-11 NOTE — Discharge Instructions (Signed)
Medication as directed.  Call your PCP for a referral to GI.  Take care  Dr. Adriana Simas

## 2020-02-11 NOTE — ED Provider Notes (Signed)
[facility]    CSN: [id number] Arrival date & time: 02/11/20  1127      History   Chief Complaint Chief Complaint  Patient presents with  . Abdominal Pain   HPI  29 year old male presents with abdominal pain.  2-day history of abdominal pain.  Now worsening.  Severe.  Located diffusely.  Pain 9/10 in severity.  He reports associated nausea, vomiting, diarrhea.  No fever.  He states that he does "sweat a lot".  No relieving factors.  He finds it difficult to get comfortable.  No urinary symptoms.  He does note some back pain as well.  No other reported symptoms.  No other complaints.  Past Medical History:  Diagnosis Date  . Anxiety   . CRPS (complex regional pain syndrome type I)   . Environmental and seasonal allergies    can lead to brochitis  . Kidney infection 2015   history of    Patient Active Problem List   Diagnosis Date Noted  . 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  . Panic attacks 01/28/2017  . Benzodiazepine overdose 01/28/2017  . Acute hypoxemic respiratory failure (HCC) 03/19/2016  . Encounter for orogastric (OG) tube placement   . Aspiration into airway   . Neck mass     Past Surgical History:  Procedure Laterality Date  . KNEE ARTHROSCOPY WITH LATERAL MENISECTOMY Right 07/26/2015   Procedure: KNEE ARTHROSCOPY WITH LATERAL MENISECTOMY, DEBRIDEMENT OF FAT PAD;  Surgeon: Kennedy Bucker, MD;  Location: [facility] ORS;  Service: Orthopedics;  Laterality: Right;  . NO PAST SURGERIES         Home Medications    Prior to Admission medications   Medication Sig Start Date End Date Taking? Authorizing Provider  divalproex (DEPAKOTE) 500 MG DR tablet 2 po in am and 2 po in pm 06/12/18  Yes [provider]  dicyclomine (BENTYL) 20 MG tablet Take 1 tablet (20 mg total) by mouth 4 (four) times daily as needed (abdominal pain). 02/11/20   Tommie Sams, DO  ketorolac (TORADOL) 10 MG tablet Take 1 tablet (10 mg total) by  mouth every 6 (six) hours as needed for moderate pain or severe pain. 02/11/20   Tommie Sams, DO  ondansetron (ZOFRAN ODT) 4 MG disintegrating tablet Take 1 tablet (4 mg total) by mouth every 8 (eight) hours as needed for nausea or vomiting. 02/11/20   Tommie Sams, DO  albuterol-ipratropium (COMBIVENT) 18-103 MCG/ACT inhaler Inhale 1-2 puffs into the lungs every 6 (six) hours as needed for wheezing or shortness of breath.  08/23/19  [provider]  clonazePAM (KLONOPIN) 1 MG tablet Take 1 mg by mouth at bedtime as needed for anxiety.  02/11/20  [provider]  CLONIDINE HCL PO Take 1 tablet by mouth 2 (two) times daily as needed (anxiety). Reported on 07/26/2015  08/23/19  [provider]  FLUoxetine (PROZAC) 20 MG tablet Take 20 mg by mouth daily.  08/23/19  [provider]  gabapentin (NEURONTIN) 300 MG capsule Take 300 mg by mouth 3 (three) times daily.  08/23/19  [provider]  QUEtiapine (SEROQUEL) 50 MG tablet Take 2 tablets (100 mg total) by mouth at bedtime. Take half pill up to twice a day as needed for anxiety.  Take 2 pills regularly every night for sleep and mood. 01/28/17 08/23/19  Clapacs, Jackquline Denmark, MD  sertraline (ZOLOFT) 50 MG tablet Take 50 mg by mouth daily. Reported on 07/26/2015  08/23/19  [provider]  Family History Family History  Problem Relation Age of Onset  . Anxiety disorder Mother     Social History Social History   Tobacco Use  . Smoking status: Current Every Day Smoker    Packs/day: 0.50    Types: Cigarettes  . Smokeless tobacco: Former Clinical biochemist  . Vaping Use: Never used  Substance Use Topics  . Alcohol use: Yes    Alcohol/week: 2.0 standard drinks    Types: 2 Cans of beer per week    Comment: every three days  . Drug use: No    Types: Marijuana    Comment: over 1 year ago     Allergies   Tramadol, Hydrocodone, Hydrocodone, and Tramadol   Review of Systems Review of Systems    Constitutional: Negative for fever.  Gastrointestinal: Positive for abdominal pain, diarrhea, nausea and vomiting.   Physical Exam Triage Vital Signs ED Triage Vitals  Enc Vitals Group     BP 02/11/20 1148 (!) 152/107     Pulse Rate 02/11/20 1148 88     Resp 02/11/20 1148 18     Temp 02/11/20 1148 98.3 F (36.8 C)     Temp Source 02/11/20 1148 Oral     SpO2 02/11/20 1148 99 %     Weight 02/11/20 1146 300 lb (136.1 kg)     Height 02/11/20 1146 5\' 11"  (1.803 m)     Head Circumference --      Peak Flow --      Pain Score 02/11/20 1146 9     Pain Loc --      Pain Edu? --      Excl. in GC? --    Updated Vital Signs BP (!) 152/107 (BP Location: Right Arm)   Pulse 88   Temp 98.3 F (36.8 C) (Oral)   Resp 18   Ht 5\' 11"  (1.803 m)   Wt 136.1 kg   SpO2 99%   BMI 41.84 kg/m   Visual Acuity Right Eye Distance:   Left Eye Distance:   Bilateral Distance:    Right Eye Near:   Left Eye Near:    Bilateral Near:     Physical Exam Vitals and nursing note reviewed.  Constitutional:      Comments: Appears in distress secondary to pain.  HENT:     Head: Normocephalic and atraumatic.  Eyes:     General:        Right eye: No discharge.        Left eye: No discharge.     Conjunctiva/sclera: Conjunctivae normal.  Cardiovascular:     Rate and Rhythm: Normal rate and regular rhythm.     Heart sounds: No murmur heard.   Pulmonary:     Effort: Pulmonary effort is normal.     Breath sounds: Normal breath sounds. No wheezing, rhonchi or rales.  Abdominal:     Palpations: Abdomen is soft.     Comments: Guarding present.  Patient with exquisite tenderness to palpation diffusely.  Neurological:     Mental Status: He is alert.    UC Treatments / Results  Labs (all labs ordered are listed, but only abnormal results are displayed) Labs Reviewed  COMPREHENSIVE METABOLIC PANEL - Abnormal; Notable for the following components:      Result Value   ALT 56 (*)    All other  components within normal limits  CBC WITH DIFFERENTIAL/PLATELET  LIPASE, BLOOD    EKG   Radiology CT ABDOMEN PELVIS W CONTRAST  Result Date: 02/11/2020 CLINICAL DATA:  29 year old male with history of acute onset of severe lower abdominal pain worsening over the past few hours. EXAM: CT ABDOMEN AND PELVIS WITH CONTRAST TECHNIQUE: Multidetector CT imaging of the abdomen and pelvis was performed using the standard protocol following bolus administration of intravenous contrast. CONTRAST:  OMNIPAQUE IOHEXOL 300 MG/ML  SOLN COMPARISON:  CT the abdomen and pelvis 08/08/2014. FINDINGS: Lower chest: Unremarkable. Hepatobiliary: Severe diffuse low attenuation throughout the hepatic parenchyma, indicative of severe hepatic steatosis. No discrete cystic or solid hepatic lesions. No intra or extrahepatic biliary ductal dilatation. Gallbladder is normal in appearance. Pancreas: No pancreatic mass. No pancreatic ductal dilatation. No pancreatic or peripancreatic fluid collections or inflammatory changes. Spleen: Unremarkable. Adrenals/Urinary Tract: Bilateral kidneys and adrenal glands are normal in appearance. No hydroureteronephrosis. Urinary bladder is normal in appearance. Stomach/Bowel: Normal appearance of the stomach. No pathologic dilatation of small bowel or colon. Normal appendix. Vascular/Lymphatic: No significant atherosclerotic disease, aneurysm or dissection noted in the abdominal or pelvic vasculature. No lymphadenopathy noted in the abdomen or pelvis. Reproductive: Prostate gland and seminal vesicles are unremarkable in appearance. Other: No significant volume of ascites.  No pneumoperitoneum. Musculoskeletal: There are no aggressive appearing lytic or blastic lesions noted in the visualized portions of the skeleton. IMPRESSION: 1. No acute findings are noted in the abdomen or pelvis to account for the patient's symptoms. 2. Severe hepatic steatosis. Electronically Signed   By: Trudie Reed  M.D.   On: 02/11/2020 14:21    Procedures Procedures (including critical care time)  Medications Ordered in UC Medications - No data to display  Initial Impression / Assessment and Plan / UC Course  I have reviewed the triage vital signs and the nursing notes.  Pertinent labs & imaging results that were available during my care of the patient were reviewed by me and considered in my medical decision making (see chart for details).    29 year old male presents with severe abdominal pain.  Laboratory studies obtained and were unremarkable.  CT abdomen and pelvis was obtained given physical exam findings and concern for acute abdomen.  CT revealed no acute pathology to account for his abdominal pain.  He does have severe hepatic steatosis.  Advised that he needs to see GI.  Needs to get referral from his primary care provider as he has to do so due to Medicaid.  Dicyclomine and Zofran as needed.  Toradol as needed for severe pain.  Supportive care.  Final Clinical Impressions(s) / UC Diagnoses   Final diagnoses:  Hepatic steatosis  Diffuse abdominal pain     Discharge Instructions     Medication as directed.  Call your PCP for a referral to GI.  Take care  Dr. Adriana Simas     ED Prescriptions    Medication Sig Dispense Auth. Provider   ketorolac (TORADOL) 10 MG tablet Take 1 tablet (10 mg total) by mouth every 6 (six) hours as needed for moderate pain or severe pain. 20 tablet Farrie Sann G, DO   ondansetron (ZOFRAN ODT) 4 MG disintegrating tablet Take 1 tablet (4 mg total) by mouth every 8 (eight) hours as needed for nausea or vomiting. 20 tablet Etai Copado G, DO   dicyclomine (BENTYL) 20 MG tablet Take 1 tablet (20 mg total) by mouth 4 (four) times daily as needed (abdominal pain). 30 tablet Tommie Sams, DO     PDMP not reviewed this encounter.   Tommie Sams, [place] 02/11/20 1448

## 2020-11-07 ENCOUNTER — Ambulatory Visit
Admission: EM | Admit: 2020-11-07 | Discharge: 2020-11-07 | Disposition: A | Discharge status: Home / Self Care | Payer: Medicaid Other | Attending: Family Medicine | Admitting: Family Medicine

## 2020-11-07 ENCOUNTER — Inpatient Hospital Stay
Admission: EM | Admit: 2020-11-07 | Discharge: 2020-11-09 | DRG: 202 | Disposition: A | Discharge status: Home / Self Care | Payer: Medicaid Other | Attending: Internal Medicine | Admitting: Internal Medicine

## 2020-11-07 ENCOUNTER — Emergency Department: Payer: Medicaid Other

## 2020-11-07 ENCOUNTER — Other Ambulatory Visit: Payer: Self-pay

## 2020-11-07 DIAGNOSIS — J9601 Acute respiratory failure with hypoxia: Secondary | ICD-10-CM | POA: Diagnosis present

## 2020-11-07 DIAGNOSIS — F32A Depression, unspecified: Secondary | ICD-10-CM | POA: Diagnosis present

## 2020-11-07 DIAGNOSIS — Z888 Allergy status to other drugs, medicaments and biological substances status: Secondary | ICD-10-CM | POA: Diagnosis not present

## 2020-11-07 DIAGNOSIS — J440 Chronic obstructive pulmonary disease with acute lower respiratory infection: Secondary | ICD-10-CM | POA: Diagnosis present

## 2020-11-07 DIAGNOSIS — Z886 Allergy status to analgesic agent status: Secondary | ICD-10-CM | POA: Diagnosis not present

## 2020-11-07 DIAGNOSIS — Z720 Tobacco use: Secondary | ICD-10-CM | POA: Diagnosis present

## 2020-11-07 DIAGNOSIS — J209 Acute bronchitis, unspecified: Principal | ICD-10-CM | POA: Diagnosis present

## 2020-11-07 DIAGNOSIS — J441 Chronic obstructive pulmonary disease with (acute) exacerbation: Secondary | ICD-10-CM | POA: Diagnosis present

## 2020-11-07 DIAGNOSIS — Z79899 Other long term (current) drug therapy: Secondary | ICD-10-CM

## 2020-11-07 DIAGNOSIS — J9811 Atelectasis: Secondary | ICD-10-CM | POA: Diagnosis present

## 2020-11-07 DIAGNOSIS — R062 Wheezing: Secondary | ICD-10-CM | POA: Diagnosis not present

## 2020-11-07 DIAGNOSIS — F17210 Nicotine dependence, cigarettes, uncomplicated: Secondary | ICD-10-CM | POA: Diagnosis present

## 2020-11-07 DIAGNOSIS — R0602 Shortness of breath: Secondary | ICD-10-CM

## 2020-11-07 DIAGNOSIS — F419 Anxiety disorder, unspecified: Secondary | ICD-10-CM | POA: Diagnosis present

## 2020-11-07 DIAGNOSIS — Z885 Allergy status to narcotic agent status: Secondary | ICD-10-CM

## 2020-11-07 DIAGNOSIS — Z818 Family history of other mental and behavioral disorders: Secondary | ICD-10-CM | POA: Diagnosis not present

## 2020-11-07 DIAGNOSIS — Z20822 Contact with and (suspected) exposure to covid-19: Secondary | ICD-10-CM | POA: Diagnosis present

## 2020-11-07 DIAGNOSIS — R0902 Hypoxemia: Secondary | ICD-10-CM

## 2020-11-07 LAB — CBC WITH DIFFERENTIAL/PLATELET
Abs Immature Granulocytes: 0.04 10*3/uL (ref 0.00–0.07)
Basophils Absolute: 0 10*3/uL (ref 0.0–0.1)
Basophils Relative: 0 %
Eosinophils Absolute: 0.1 10*3/uL (ref 0.0–0.5)
Eosinophils Relative: 1 %
HCT: 43.7 % (ref 39.0–52.0)
Hemoglobin: 15 g/dL (ref 13.0–17.0)
Immature Granulocytes: 0 %
Lymphocytes Relative: 11 %
Lymphs Abs: 1 10*3/uL (ref 0.7–4.0)
MCH: 28.1 pg (ref 26.0–34.0)
MCHC: 34.3 g/dL (ref 30.0–36.0)
MCV: 81.8 fL (ref 80.0–100.0)
Monocytes Absolute: 0.2 10*3/uL (ref 0.1–1.0)
Monocytes Relative: 2 %
Neutro Abs: 8.2 10*3/uL — ABNORMAL HIGH (ref 1.7–7.7)
Neutrophils Relative %: 86 %
Platelets: 251 10*3/uL (ref 150–400)
RBC: 5.34 MIL/uL (ref 4.22–5.81)
RDW: 14.9 % (ref 11.5–15.5)
WBC: 9.6 10*3/uL (ref 4.0–10.5)
nRBC: 0 % (ref 0.0–0.2)

## 2020-11-07 LAB — BASIC METABOLIC PANEL
Anion gap: 6 (ref 5–15)
BUN: 10 mg/dL (ref 6–20)
CO2: 25 mmol/L (ref 22–32)
Calcium: 9.7 mg/dL (ref 8.9–10.3)
Chloride: 103 mmol/L (ref 98–111)
Creatinine, Ser: 0.99 mg/dL (ref 0.61–1.24)
GFR, Estimated: >60 mL/min (ref >60)
Glucose, Bld: 103 mg/dL — ABNORMAL HIGH (ref 70–99)
Potassium: 4.2 mmol/L (ref 3.5–5.1)
Sodium: 134 mmol/L — ABNORMAL LOW (ref 135–145)

## 2020-11-07 LAB — RESP PANEL BY RT-PCR (FLU A&B, COVID) ARPGX2
Influenza A by PCR: NEGATIVE
Influenza B by PCR: NEGATIVE
SARS Coronavirus 2 by RT PCR: NEGATIVE

## 2020-11-07 MED ORDER — IPRATROPIUM-ALBUTEROL 0.5-2.5 (3) MG/3ML IN SOLN
3.0000 mL | Freq: Once | RESPIRATORY_TRACT | Status: AC
  Administered 2020-11-07: 3 mL via RESPIRATORY_TRACT

## 2020-11-07 MED ORDER — IPRATROPIUM-ALBUTEROL 0.5-2.5 (3) MG/3ML IN SOLN
3.0000 mL | Freq: Once | RESPIRATORY_TRACT | Status: AC
  Administered 2020-11-07: 3 mL via RESPIRATORY_TRACT
  Filled 2020-11-07: qty 9

## 2020-11-07 MED ORDER — IPRATROPIUM-ALBUTEROL 0.5-2.5 (3) MG/3ML IN SOLN
3.0000 mL | Freq: Four times a day (QID) | RESPIRATORY_TRACT | Status: DC
  Administered 2020-11-08 – 2020-11-09 (×7): 3 mL via RESPIRATORY_TRACT
  Filled 2020-11-07 (×6): qty 3

## 2020-11-07 MED ORDER — ACETAMINOPHEN 650 MG RE SUPP: 650.0000 mg | Freq: Four times a day (QID) | RECTAL | Status: DC | PRN

## 2020-11-07 MED ORDER — ALBUTEROL SULFATE (2.5 MG/3ML) 0.083% IN NEBU: 2.5000 mg | INHALATION_SOLUTION | RESPIRATORY_TRACT | Status: DC | PRN

## 2020-11-07 MED ORDER — ACETAMINOPHEN 325 MG PO TABS
650.0000 mg | ORAL_TABLET | Freq: Four times a day (QID) | ORAL | Status: DC | PRN
  Administered 2020-11-08: 650 mg via ORAL
  Filled 2020-11-07 (×2): qty 2

## 2020-11-07 MED ORDER — METHYLPREDNISOLONE SODIUM SUCC 40 MG IJ SOLR
40.0000 mg | Freq: Four times a day (QID) | INTRAMUSCULAR | Status: DC
  Administered 2020-11-08 – 2020-11-09 (×7): 40 mg via INTRAVENOUS
  Filled 2020-11-07 (×6): qty 1

## 2020-11-07 NOTE — ED Triage Notes (Signed)
Patient BIBA from Urgent care for difficulty breathing x 1 day.  Patient received Albuterol, 1 Duoneb, 125mg  Solumedrol, 2g Magnesium en route. O2 sats upon arrival 84%

## 2020-11-07 NOTE — ED Notes (Signed)
Pt ambulatory to and from restroom with independent steady gait with O2 maintaining between 92-99% on room air

## 2020-11-07 NOTE — ED Triage Notes (Signed)
Pt presents in respiratory distress, PCP notified and at bedside

## 2020-11-07 NOTE — ED Provider Notes (Signed)
[facility]    CSN: [id number] Arrival date & time: 11/07/20  1743      History   Chief Complaint Chief Complaint  Patient presents with   Shortness of Breath    HPI 30 year old male presents with shortness of breath.  Patient reports that yesterday he developed cough.  Cough has progressed.  This morning he developed severe shortness of breath.  He states that he is having difficulty catching his breath.  Patient is in respiratory distress at this time.  Having difficulty speaking and is very tachypneic.  No reported fever but his temperature is currently 100.2.  Patient also reports some abdominal discomfort as well as left-sided chest pain.  Also notes sore throat.  Past Medical History:  Diagnosis Date   Anxiety    CRPS (complex regional pain syndrome type I)    Environmental and seasonal allergies    can lead to brochitis   Kidney infection 2015   history of    Patient Active Problem List   Diagnosis Date Noted   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   Panic attacks 01/28/2017   Benzodiazepine overdose 01/28/2017   Acute hypoxemic respiratory failure (HCC) 03/19/2016   Encounter for orogastric (OG) tube placement    Aspiration into airway    Neck mass     Past Surgical History:  Procedure Laterality Date   KNEE ARTHROSCOPY WITH LATERAL MENISECTOMY Right 07/26/2015   Procedure: KNEE ARTHROSCOPY WITH LATERAL MENISECTOMY, DEBRIDEMENT OF FAT PAD;  Surgeon: Kennedy Bucker, MD;  Location: [facility] ORS;  Service: Orthopedics;  Laterality: Right;   NO PAST SURGERIES         Home Medications    Prior to Admission medications   Medication Sig Start Date End Date Taking? Authorizing Provider  dicyclomine (BENTYL) 20 MG tablet Take 1 tablet (20 mg total) by mouth 4 (four) times daily as needed (abdominal pain). 02/11/20   Tommie Sams, DO  divalproex (DEPAKOTE) 500 MG DR tablet 2 po in am and 2 po in pm 06/12/18   [provider]   ketorolac (TORADOL) 10 MG tablet Take 1 tablet (10 mg total) by mouth every 6 (six) hours as needed for moderate pain or severe pain. 02/11/20   Tommie Sams, DO  ondansetron (ZOFRAN ODT) 4 MG disintegrating tablet Take 1 tablet (4 mg total) by mouth every 8 (eight) hours as needed for nausea or vomiting. 02/11/20   Tommie Sams, DO  albuterol-ipratropium (COMBIVENT) 18-103 MCG/ACT inhaler Inhale 1-2 puffs into the lungs every 6 (six) hours as needed for wheezing or shortness of breath.  08/23/19  [provider]  clonazePAM (KLONOPIN) 1 MG tablet Take 1 mg by mouth at bedtime as needed for anxiety.  02/11/20  [provider]  CLONIDINE HCL PO Take 1 tablet by mouth 2 (two) times daily as needed (anxiety). Reported on 07/26/2015  08/23/19  [provider]  FLUoxetine (PROZAC) 20 MG tablet Take 20 mg by mouth daily.  08/23/19  [provider]  gabapentin (NEURONTIN) 300 MG capsule Take 300 mg by mouth 3 (three) times daily.  08/23/19  [provider]  QUEtiapine (SEROQUEL) 50 MG tablet Take 2 tablets (100 mg total) by mouth at bedtime. Take half pill up to twice a day as needed for anxiety.  Take 2 pills regularly every night for sleep and mood. 01/28/17 08/23/19  Clapacs, Jackquline Denmark, MD  sertraline (ZOLOFT) 50 MG tablet Take 50 mg by mouth daily. Reported on  07/26/2015  08/23/19  [provider]    Family History Family History  Problem Relation Age of Onset   Anxiety disorder Mother     Social History Social History   Tobacco Use   Smoking status: Every Day    Packs/day: 0.50    Types: Cigarettes   Smokeless tobacco: Former  Building services engineer Use: Never used  Substance Use Topics   Alcohol use: Yes    Alcohol/week: 2.0 standard drinks    Types: 2 Cans of beer per week    Comment: every three days   Drug use: No    Types: Marijuana    Comment: over 1 year ago     Allergies   Tramadol, Hydrocodone, Hydrocodone, and  Tramadol   Review of Systems Review of Systems Per HPI   Physical Exam Triage Vital Signs ED Triage Vitals  Enc Vitals Group     BP 11/07/20 1758 (!) 170/112     Pulse Rate 11/07/20 1758 (!) 117     Resp 11/07/20 1758 (!) 30     Temp 11/07/20 1758 100.2 F (37.9 C)     Temp Source 11/07/20 1758 Oral     SpO2 11/07/20 1758 100 %     Weight --      Height --      Head Circumference --      Peak Flow --      Pain Score 11/07/20 1759 8     Pain Loc --      Pain Edu? --      Excl. in GC? --    Updated Vital Signs BP (!) 170/112 (BP Location: Right Arm)   Pulse (!) 117   Temp 100.2 F (37.9 C) (Oral)   Resp (!) 30   SpO2 100%   Visual Acuity Right Eye Distance:   Left Eye Distance:   Bilateral Distance:    Right Eye Near:   Left Eye Near:    Bilateral Near:     Physical Exam Vitals and nursing note reviewed.  Constitutional:      Comments: Obese.  Appears in distress secondary to shortness of breath  HENT:     Head: Normocephalic and atraumatic.     Mouth/Throat:     Pharynx: Oropharynx is clear.  Eyes:     General:        Right eye: No discharge.        Left eye: No discharge.     Conjunctiva/sclera: Conjunctivae normal.  Cardiovascular:     Rate and Rhythm: Regular rhythm. Tachycardia present.  Pulmonary:     Effort: Respiratory distress present.     Breath sounds: Wheezing present.  Neurological:     Mental Status: He is alert.     UC Treatments / Results  Labs (all labs ordered are listed, but only abnormal results are displayed) Labs Reviewed - No data to display  EKG   Radiology No results found.  Procedures Procedures (including critical care time)  Medications Ordered in UC Medications - No data to display  Initial Impression / Assessment and Plan / UC Course  I have reviewed the triage vital signs and the nursing notes.  Pertinent labs & imaging results that were available during my care of the patient were reviewed by me and  considered in my medical decision making (see chart for details).    30 year old male presents with acute dyspnea.  Wheezing on exam.  Patient is in distress.  Patient is  going to the hospital for further evaluation and management.  Needs COVID testing, treatments, and continuous monitoring.  Patient is being transported via EMS.  Final Clinical Impressions(s) / UC Diagnoses   Final diagnoses:  Shortness of breath  Wheezing   Discharge Instructions   None    ED Prescriptions   None    PDMP not reviewed this encounter.   Tommie Sams, [place] 11/07/20 1904

## 2020-11-07 NOTE — ED Notes (Signed)
Pt taken to room 3, report given to [facility]

## 2020-11-07 NOTE — ED Notes (Signed)
EMS at bedside to transport to ER for further treatment.

## 2020-11-07 NOTE — ED Notes (Signed)
Pt lying in bed after breathing tx. Pt wheezes gone at this time, however pt still hypoxic without oxygen. Pt placed back on oxygen 2L.

## 2020-11-07 NOTE — ED Provider Notes (Signed)
[facility] Emergency Department Provider Note  ____________________________________________   I have reviewed the triage vital signs and the nursing notes.   HISTORY  Chief Complaint Respiratory Distress   History limited by: Not limited   HPI Jason Collier is a 30 y.o. male who presents to the emergency department today because of concern for shortness of breath. The patient states that his symptoms started yesterday. Noticed that he was having a harder time sleeping. The shortness of breath was accompanied by some left sided chest tightness and discomfort. He has also had a cough and now a sore throat which he thinks might be related to the cough. Has had some muscle aches. The patient denies any known sick contacts. Denies any previous history of lung disease although states he has had bad bronchitis in the past.    Records reviewed. Per medical record review patient has a history of bronchitis.   Past Medical History:  Diagnosis Date   Anxiety    CRPS (complex regional pain syndrome type I)    Environmental and seasonal allergies    can lead to brochitis   Kidney infection 2015   history of    Patient Active Problem List   Diagnosis Date Noted   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   Panic attacks 01/28/2017   Benzodiazepine overdose 01/28/2017   Acute hypoxemic respiratory failure (HCC) 03/19/2016   Encounter for orogastric (OG) tube placement    Aspiration into airway    Neck mass     Past Surgical History:  Procedure Laterality Date   KNEE ARTHROSCOPY WITH LATERAL MENISECTOMY Right 07/26/2015   Procedure: KNEE ARTHROSCOPY WITH LATERAL MENISECTOMY, DEBRIDEMENT OF FAT PAD;  Surgeon: Kennedy Bucker, MD;  Location: [facility] ORS;  Service: Orthopedics;  Laterality: Right;   NO PAST SURGERIES      Prior to Admission medications   Medication Sig Start Date End Date Taking? Authorizing Provider  dicyclomine (BENTYL) 20 MG  tablet Take 1 tablet (20 mg total) by mouth 4 (four) times daily as needed (abdominal pain). 02/11/20   Tommie Sams, DO  divalproex (DEPAKOTE) 500 MG DR tablet 2 po in am and 2 po in pm 06/12/18   [provider]  ketorolac (TORADOL) 10 MG tablet Take 1 tablet (10 mg total) by mouth every 6 (six) hours as needed for moderate pain or severe pain. 02/11/20   Tommie Sams, DO  ondansetron (ZOFRAN ODT) 4 MG disintegrating tablet Take 1 tablet (4 mg total) by mouth every 8 (eight) hours as needed for nausea or vomiting. 02/11/20   Tommie Sams, DO  albuterol-ipratropium (COMBIVENT) 18-103 MCG/ACT inhaler Inhale 1-2 puffs into the lungs every 6 (six) hours as needed for wheezing or shortness of breath.  08/23/19  [provider]  clonazePAM (KLONOPIN) 1 MG tablet Take 1 mg by mouth at bedtime as needed for anxiety.  02/11/20  [provider]  CLONIDINE HCL PO Take 1 tablet by mouth 2 (two) times daily as needed (anxiety). Reported on 07/26/2015  08/23/19  [provider]  FLUoxetine (PROZAC) 20 MG tablet Take 20 mg by mouth daily.  08/23/19  [provider]  gabapentin (NEURONTIN) 300 MG capsule Take 300 mg by mouth 3 (three) times daily.  08/23/19  [provider]  QUEtiapine (SEROQUEL) 50 MG tablet Take 2 tablets (100 mg total) by mouth at bedtime. Take half pill up to twice a day as needed for anxiety.  Take 2 pills regularly  every night for sleep and mood. 01/28/17 08/23/19  Clapacs, Jackquline Denmark, MD  sertraline (ZOLOFT) 50 MG tablet Take 50 mg by mouth daily. Reported on 07/26/2015  08/23/19  [provider]    Allergies Tramadol, Hydrocodone, Hydrocodone, and Tramadol  Family History  Problem Relation Age of Onset   Anxiety disorder Mother     Social History Social History   Tobacco Use   Smoking status: Every Day    Packs/day: 0.50    Types: Cigarettes   Smokeless tobacco: Former  Building services engineer Use: Never used  Substance Use  Topics   Alcohol use: Yes    Alcohol/week: 2.0 standard drinks    Types: 2 Cans of beer per week    Comment: every three days   Drug use: No    Types: Marijuana    Comment: over 1 year ago    Review of Systems Constitutional: No fever/chills Eyes: No visual changes. ENT: No sore throat. Cardiovascular: Positive for chest pain. Respiratory: Positive for shortness of breath. Gastrointestinal: No abdominal pain.  No nausea, no vomiting.  No diarrhea.   Genitourinary: Negative for dysuria. Musculoskeletal: Negative for back pain. Skin: Negative for rash. Neurological: Negative for headaches, focal weakness or numbness.  ____________________________________________   PHYSICAL EXAM:  VITAL SIGNS: ED Triage Vitals  Enc Vitals Group     BP 11/07/20 1902 (!) 129/117     Pulse Rate 11/07/20 1902 (!) 105     Resp 11/07/20 1902 (!) 24     Temp --      Temp src --      SpO2 11/07/20 1902 95 %     Weight 11/07/20 1903 290 lb (131.5 kg)     Height 11/07/20 1903 5\' 10"  (1.778 m)     Head Circumference --      Peak Flow --      Pain Score 11/07/20 1903 3   Constitutional: Alert and oriented.  Eyes: Conjunctivae are normal.  ENT      Head: Normocephalic and atraumatic.      Nose: No congestion/rhinnorhea.      Mouth/Throat: Mucous membranes are moist.      Neck: No stridor. Hematological/Lymphatic/Immunilogical: No cervical lymphadenopathy. Cardiovascular: Tachycardia, regular rhythm.  No murmurs, rubs, or gallops.  Respiratory: Increased respiratory effort. Diffuse expiratory wheezing.  Gastrointestinal: Soft and non tender. No rebound. No guarding.  Genitourinary: Deferred Musculoskeletal: Normal range of motion in all extremities. No lower extremity edema. Neurologic:  Normal speech and language. No gross focal neurologic deficits are appreciated.  Skin:  Skin is warm, dry and intact. No rash noted. Psychiatric: Mood and affect are normal. Speech and behavior are normal.  Patient exhibits appropriate insight and judgment.  ____________________________________________    LABS (pertinent positives/negatives)  CBC wbc 9.6, hgb 15.0, plt 251 BMP na 134, k 4.2, glu 103, cr 0.99 COVID negative  ____________________________________________   EKG  I, 01/07/21, attending physician, personally viewed and interpreted this EKG  EKG Time: 1927 Rate: 87 Rhythm: normal sinus rhythm Axis: rightward axis Intervals: qtc 433 QRS: incomplete RBBB ST changes: no st elevation Impression: abnormal ekg  ____________________________________________    RADIOLOGY  CXR Mild bibasilar atelectasis  ____________________________________________   PROCEDURES  Procedures  ____________________________________________   INITIAL IMPRESSION / ASSESSMENT AND PLAN / ED COURSE  Pertinent labs & imaging results that were available during my care of the patient were reviewed by me and considered in my medical decision making (see chart for details).  Patient presented to the emergency department today because of concern for shortness of breath. On exam patient with diffuse expiratory wheezing. Was given solumedrol by EMS. CXR without pneumonia. COVID negative. Patient was given duoneb treatments here. After treatment was ambulated and desated below 90. At this time will plan on admission.   ____________________________________________   FINAL CLINICAL IMPRESSION(S) / ED DIAGNOSES  Final diagnoses:  Hypoxia  Shortness of breath     Note: This dictation was prepared with Dragon dictation. Any transcriptional errors that result from this process are unintentional     Phineas Semen, MD 11/08/20 [phone]

## 2020-11-07 NOTE — H&P (Signed)
History and Physical    PLEASE NOTE THAT DRAGON DICTATION SOFTWARE WAS USED IN THE CONSTRUCTION OF THIS NOTE.   Jason Collier EHM:[id number] DOB: May 24, 1990 DOA: 11/07/2020  PCP: Patient, No Pcp Per (Inactive) Patient coming from: home   I have personally briefly reviewed patient's old medical records in [facility]  Chief Complaint: Shortness of breath  HPI: Jason Collier is a 30 y.o. male with medical history significant for allergic rhinitis, depression who is admitted to [facility] on 11/07/2020 with acute bronchitis with bronchospasm after presenting from home to [facility] ED complaining of shortness of breath.   The patient reports 2 days of shortness of breath associated with wheezing, nonproductive cough in the absence of any associated hemoptysis.  He reports preceding rhinitis and rhinorrhea, but denies any recent subjective fever, chills, rigors, or generalized myalgias.  Not associate with any orthopnea, PND, or new onset peripheral edema.  Denies any recent chest pain, diaphoresis, palpitations, nausea, vomiting, presyncope, or syncope.  No new lower extremity erythema or calf tenderness.  Denies any recent trauma, travel, surgical procedures, or periods of prolonged diminished ambulatory status.  No history of DVT/PE.  He also denies any recent headache, neck stiffness, abdominal pain, diarrhea, or rash.  No recent known COVID-19 exposures.  Denies any recent dysuria, gross hematuria, or change in urinary urgency/frequency.  He denies any known chronic underlying pulmonary pathology, including no known history of asthma or COPD.  However, he reports a significant episode of acute bronchitis when he was a child, during which she reports he required intubation.  Denies any baseline supplemental oxygen requirements.  He acknowledges that he is a current smoker, smoking approximately half pack per day over the last 5 years.  He also notes a history of allergic  rhinitis in the setting of seasonal allergies, but denies any use of antihistamines or intranasal corticosteroids as an outpatient.    ED Course:  Vital signs in the ED were notable for the following:  - Tetramex 98.6, heart rate 89-1 05; blood pressure 141/70 161/91; respiratory rate 18-24, oxygen saturation initially noted to be 89% on room air, and subsequently improved to 93 to 96% on 2 L nasal cannula.  Labs were notable for the following: BMP was notable for the following: Sodium 134, bicarbonate 25, creatinine 0.99.  CBC notable for white blood cell count 9600, hemoglobin 15.  Screening nasopharyngeal COVID-19 PCR as well as influenza PCR were checked in the ED today and found to be negative.  EKG, by way of comparison to most recent prior from October 2018, showed sinus rhythm heart rate 87, normal intervals, nonspecific T wave inversion in lead III, nonspecific less than 1 mm ST elevation in V2 and V3, all of which are completely unchanged from most recent prior EKG.  Chest x-ray shows mild bibasilar atelectasis, but otherwise shows no evidence of acute cardiopulmonary process, including no evidence of overt infiltrate, edema, effusion, or pneumothorax.  While in the ED, the following were administered: Duo nebulizer treatment x1.    Review of Systems: As per HPI otherwise 10 point review of systems negative.   Past Medical History:  Diagnosis Date   Anxiety    CRPS (complex regional pain syndrome type I)    Environmental and seasonal allergies    can lead to brochitis   Kidney infection 2015   history of    Past Surgical History:  Procedure Laterality Date   KNEE ARTHROSCOPY WITH LATERAL MENISECTOMY Right 07/26/2015  Procedure: KNEE ARTHROSCOPY WITH LATERAL MENISECTOMY, DEBRIDEMENT OF FAT PAD;  Surgeon: Kennedy BuckerMichael Menz, MD;  Location: [facility] ORS;  Service: Orthopedics;  Laterality: Right;   NO PAST SURGERIES      Social History:  reports that he has been smoking cigarettes. He  has been smoking an average of .5 packs per day. He has quit using smokeless tobacco. He reports current alcohol use of about 2.0 standard drinks of alcohol per week. He reports that he does not use drugs.   Allergies  Allergen Reactions   Tramadol Itching   Hydrocodone Nausea And Vomiting   Hydrocodone Nausea And Vomiting   Tramadol Itching    Family History  Problem Relation Age of Onset   Anxiety disorder Mother     Family history reviewed and not pertinent    Prior to Admission medications   Medication Sig Start Date End Date Taking? Authorizing Provider  dicyclomine (BENTYL) 20 MG tablet Take 1 tablet (20 mg total) by mouth 4 (four) times daily as needed (abdominal pain). 02/11/20   Tommie Samsook, Jayce G, DO  divalproex (DEPAKOTE) 500 MG DR tablet 2 po in am and 2 po in pm 06/12/18   [provider]  ketorolac (TORADOL) 10 MG tablet Take 1 tablet (10 mg total) by mouth every 6 (six) hours as needed for moderate pain or severe pain. 02/11/20   Tommie Samsook, Jayce G, DO  ondansetron (ZOFRAN ODT) 4 MG disintegrating tablet Take 1 tablet (4 mg total) by mouth every 8 (eight) hours as needed for nausea or vomiting. 02/11/20   Tommie Samsook, Jayce G, DO  albuterol-ipratropium (COMBIVENT) 18-103 MCG/ACT inhaler Inhale 1-2 puffs into the lungs every 6 (six) hours as needed for wheezing or shortness of breath.  08/23/19  [provider]  clonazePAM (KLONOPIN) 1 MG tablet Take 1 mg by mouth at bedtime as needed for anxiety.  02/11/20  [provider]  CLONIDINE HCL PO Take 1 tablet by mouth 2 (two) times daily as needed (anxiety). Reported on 07/26/2015  08/23/19  [provider]  FLUoxetine (PROZAC) 20 MG tablet Take 20 mg by mouth daily.  08/23/19  [provider]  gabapentin (NEURONTIN) 300 MG capsule Take 300 mg by mouth 3 (three) times daily.  08/23/19  [provider]  QUEtiapine (SEROQUEL) 50 MG tablet Take 2 tablets (100 mg total) by mouth at bedtime. Take half  pill up to twice a day as needed for anxiety.  Take 2 pills regularly every night for sleep and mood. 01/28/17 08/23/19  Clapacs, Jackquline DenmarkJohn T, MD  sertraline (ZOLOFT) 50 MG tablet Take 50 mg by mouth daily. Reported on 07/26/2015  08/23/19  [provider]     Objective    Physical Exam: Vitals:   11/07/20 1902 11/07/20 1903 11/07/20 2214 11/07/20 2300  BP: (!) 129/117  (!) 141/76 (!) 161/91  Pulse: (!) 105  89 (!) 106  Resp: (!) 24  18 (!) 22  SpO2: 95%  92% 94%  Weight:  131.5 kg    Height:  5\' 10"  (1.778 m)      General: appears to be stated age; alert, oriented; mildly increased work of breathing noted Skin: warm, dry, no rash Head:  AT/[place] Mouth:  Oral mucosa membranes appear moist, normal dentition Neck: supple; trachea midline Heart:  RRR; did not appreciate any M/R/G Lungs: Bilateral expiratory wheezes noted, but otherwise CTAB, did not appreciate any rales, or rhonchi Abdomen: + BS; soft, ND, NT Vascular: 2+ pedal pulses b/l; 2+ radial pulses  b/l Extremities: no peripheral edema, no muscle wasting Neuro: strength and sensation intact in upper and lower extremities b/l    Labs on Admission: I have personally reviewed following labs and imaging studies  CBC: Recent Labs  Lab 11/07/20 2053  WBC 9.6  NEUTROABS 8.2*  HGB 15.0  HCT 43.7  MCV 81.8  PLT 251   Basic Metabolic Panel: Recent Labs  Lab 11/07/20 2053  NA 134*  K 4.2  CL 103  CO2 25  GLUCOSE 103*  BUN 10  CREATININE 0.99  CALCIUM 9.7   GFR: Estimated Creatinine Clearance: 152.8 mL/min (by C-G formula based on SCr of 0.99 mg/dL). Liver Function Tests: No results for input(s): AST, ALT, ALKPHOS, BILITOT, PROT, ALBUMIN in the last 168 hours. No results for input(s): LIPASE, AMYLASE in the last 168 hours. No results for input(s): AMMONIA in the last 168 hours. Coagulation Profile: No results for input(s): INR, PROTIME in the last 168 hours. Cardiac Enzymes: No results for input(s): CKTOTAL,  CKMB, CKMBINDEX, TROPONINI in the last 168 hours. BNP (last 3 results) No results for input(s): PROBNP in the last 8760 hours. HbA1C: No results for input(s): HGBA1C in the last 72 hours. CBG: No results for input(s): GLUCAP in the last 168 hours. Lipid Profile: No results for input(s): CHOL, HDL, LDLCALC, TRIG, CHOLHDL, LDLDIRECT in the last 72 hours. Thyroid Function Tests: No results for input(s): TSH, T4TOTAL, FREET4, T3FREE, THYROIDAB in the last 72 hours. Anemia Panel: No results for input(s): VITAMINB12, FOLATE, FERRITIN, TIBC, IRON, RETICCTPCT in the last 72 hours. Urine analysis:    Component Value Date/Time   COLORURINE YELLOW (A) 12/14/2015 1042   APPEARANCEUR CLEAR (A) 12/14/2015 1042   APPEARANCEUR Clear 12/31/2013 1713   LABSPEC 1.024 12/14/2015 1042   LABSPEC 1.015 12/31/2013 1713   PHURINE 5.0 12/14/2015 1042   GLUCOSEU NEGATIVE 12/14/2015 1042   GLUCOSEU Negative 12/31/2013 1713   HGBUR NEGATIVE 12/14/2015 1042   BILIRUBINUR NEGATIVE 12/14/2015 1042   BILIRUBINUR Negative 12/31/2013 1713   KETONESUR NEGATIVE 12/14/2015 1042   PROTEINUR NEGATIVE 12/14/2015 1042   NITRITE NEGATIVE 12/14/2015 1042   LEUKOCYTESUR NEGATIVE 12/14/2015 1042   LEUKOCYTESUR Negative 12/31/2013 1713    Radiological Exams on Admission: DG Chest Portable 1 View  Result Date: 11/07/2020 CLINICAL DATA:  Shortness of breath EXAM: PORTABLE CHEST 1 VIEW COMPARISON:  03/20/2016 FINDINGS: Cardiac shadow is within normal limits. The lungs are well aerated bilaterally. Mild bibasilar atelectatic changes are seen. No focal confluent infiltrate is noted. No bony abnormality is seen. IMPRESSION: Mild bibasilar atelectasis. Electronically Signed   By: Alcide Clever M.D.   On: 11/07/2020 19:53     EKG: Independently reviewed, with result as described above.    Assessment/Plan   Jason Collier is a 30 y.o. male with medical history significant for allergic rhinitis, depression who is admitted to  [facility] on 11/07/2020 with acute bronchitis with bronchospasm after presenting from home to [facility] ED complaining of shortness of breath.     Principal Problem:   Acute bronchitis with bronchospasm Active Problems:   Acute hypoxemic respiratory failure (HCC)   SOB (shortness of breath)   Tobacco abuse   Depression     #) Acute bronchitis with bronchospasm: Diagnosis on the basis of 2 days of progressive shortness of breath associated with increased work of breathing, wheezing to suggest element of bronchospasm, chest x-ray showing no evidence of acute cardiopulmonary process and no known underlying history of COPD.  Additionally, presentation is  associated with acute hypoxic respiratory distress, as further detailed below.  In the context of preceding rhinitis/rhinorrhea, suspect viral contribution, while noting screening nasopharyngeal COVID-19/influenza PCR, which were checked in the ED today, were found to be negative.  There is also documentation of a history of seasonal allergies, raising possibility of influence from this factor as well, and representing a potential area for outpatient optimization as the patient is not currently on any intranasal Flonase at home.  Will initiate scheduled and as needed breathing treatments, as further detailed below.  Additionally, given evidence of bronchospasm, will initiate systemic corticosteroids, as further detailed below, and check serum magnesium level.  Plan: Scheduled duo nebulizer treatments.  As needed albuterol nebulizer.  Solu-Medrol 40 mg IV every 6 hours, as above.  Monitor on telemetry and monitor continuous pulse oximetry.  Check blood gas.  Add on serum magnesium level and check serum phosphorus level.  Add on procalcitonin level to further rule out any underlying contribution from bacterial pneumonia.  Counseled the patient on the importance of complete smoking discontinuation, as further detailed below.  Incentive  spirometry in the setting of mild bibasilar atelectasis.       #) Acute hypoxic respiratory distress: Diagnosis on the basis of presenting initial O2 sat in the high 80s on room air, requiring 2 L nasal cannula in order to maintain oxygen saturations of 93 to 96%.  This is in the context of no known baseline supplemental oxygen requirements, and therefore meets criteria for acute hypoxic respiratory distress as opposed to acute hypoxic respiratory failure.  Appears to be on the basis of acute bronchitis with bronchospasm, as above, with presenting chest x-ray showing no evidence of acute cardiopulmonary process, aside from mild bibasilar atelectasis, including no evidence of infiltrate, edema, effusion, or pneumothorax.  No clinical or radiographic evidence to suggest acutely decompensated heart failure.  Clinically, presentation is less suggestive of acute pulmonary embolism.  ACS is also felt to be less likely, including no associated chest pain, while presenting EKG shows no evidence of acute ischemic changes and his unchanged from most recent prior EKG, as further detailed above.  Patient denies any known history of underlying asthma or COPD.     Plan:evaluation and management of acute bronchitis with bronchospasm, including scheduled/as needed nebulizer treatments as well as Solu-Medrol, as above.  Add on serum magnesium level and check serum phosphorus level.  Add on procalcitonin level, as above.  Check BNP.  Monitor strict I's and O's and daily weights.  Monitor on continuous pulse oximetry as well as monitoring on telemetry.  Counseled the patient on importance of complete smoking discontinuation.  Incentive spirometry.  Check blood gas.      #) Chronic tobacco abuse: The patient reports that he is a current smoker, having smoked approximately half pack per day for at least the last 5 years.  Plan: Counseled the patient on the importance of complete smoking discontinuation.  Have also  ordered a as needed nicotine patch for use during this hospitalization.       #) Depression: On Depakote as an outpatient.  Plan: Continue home Depakote.      DVT prophylaxis: SCDs Code Status: Full code Family Communication: none Disposition Plan: Per Rounding Team Consults called: none;  Admission status: Inpatient;      Of note, this patient was added by me to the following Admit List/Treatment Team: armcadmits.      PLEASE NOTE THAT DRAGON DICTATION SOFTWARE WAS USED IN THE CONSTRUCTION OF THIS NOTE.  Angie Fava DO [facility] Pager [phone] From 6PM - 2AM  Otherwise, please contact night-coverage  [email or web address] Password [facility]   11/07/2020, 11:56 PM

## 2020-11-08 ENCOUNTER — Encounter: Payer: Self-pay | Admitting: Internal Medicine

## 2020-11-08 DIAGNOSIS — F32A Depression, unspecified: Secondary | ICD-10-CM | POA: Diagnosis present

## 2020-11-08 DIAGNOSIS — R0602 Shortness of breath: Secondary | ICD-10-CM | POA: Diagnosis present

## 2020-11-08 DIAGNOSIS — Z720 Tobacco use: Secondary | ICD-10-CM | POA: Diagnosis present

## 2020-11-08 LAB — COMPREHENSIVE METABOLIC PANEL
ALT: 46 U/L — ABNORMAL HIGH (ref 0–44)
AST: 46 U/L — ABNORMAL HIGH (ref 15–41)
Albumin: 4.3 g/dL (ref 3.5–5.0)
Alkaline Phosphatase: 59 U/L (ref 38–126)
Anion gap: 10 (ref 5–15)
BUN: 11 mg/dL (ref 6–20)
CO2: 24 mmol/L (ref 22–32)
Calcium: 9.7 mg/dL (ref 8.9–10.3)
Chloride: 102 mmol/L (ref 98–111)
Creatinine, Ser: 1.08 mg/dL (ref 0.61–1.24)
GFR, Estimated: >60 mL/min (ref >60)
Glucose, Bld: 215 mg/dL — ABNORMAL HIGH (ref 70–99)
Potassium: 4 mmol/L (ref 3.5–5.1)
Sodium: 136 mmol/L (ref 135–145)
Total Bilirubin: 0.6 mg/dL (ref 0.3–1.2)
Total Protein: 8.2 g/dL — ABNORMAL HIGH (ref 6.5–8.1)

## 2020-11-08 LAB — BLOOD GAS, VENOUS
Acid-Base Excess: 0.9 mmol/L (ref 0.0–2.0)
Bicarbonate: 26 mmol/L (ref 20.0–28.0)
O2 Saturation: 91.4 %
Patient temperature: 37
pCO2, Ven: 42 mmHg — ABNORMAL LOW (ref 44.0–60.0)
pH, Ven: 7.4 (ref 7.250–7.430)
pO2, Ven: 62 mmHg — ABNORMAL HIGH (ref 32.0–45.0)

## 2020-11-08 LAB — MAGNESIUM
Magnesium: 2.1 mg/dL (ref 1.7–2.4)
Magnesium: 2.5 mg/dL — ABNORMAL HIGH (ref 1.7–2.4)

## 2020-11-08 LAB — HIV ANTIBODY (ROUTINE TESTING W REFLEX): HIV Screen 4th Generation wRfx: NONREACTIVE

## 2020-11-08 LAB — PHOSPHORUS: Phosphorus: 1.6 mg/dL — ABNORMAL LOW (ref 2.5–4.6)

## 2020-11-08 LAB — BRAIN NATRIURETIC PEPTIDE: B Natriuretic Peptide: 7.9 pg/mL (ref 0.0–100.0)

## 2020-11-08 LAB — PROCALCITONIN: Procalcitonin: <0.1 ng/mL

## 2020-11-08 LAB — CBC
HCT: 43.5 % (ref 39.0–52.0)
Hemoglobin: 14.6 g/dL (ref 13.0–17.0)
MCH: 27.4 pg (ref 26.0–34.0)
MCHC: 33.6 g/dL (ref 30.0–36.0)
MCV: 81.8 fL (ref 80.0–100.0)
Platelets: 270 10*3/uL (ref 150–400)
RBC: 5.32 MIL/uL (ref 4.22–5.81)
RDW: 15 % (ref 11.5–15.5)
WBC: 8.6 10*3/uL (ref 4.0–10.5)
nRBC: 0 % (ref 0.0–0.2)

## 2020-11-08 MED ORDER — NICOTINE 14 MG/24HR TD PT24: 14.0000 mg | MEDICATED_PATCH | Freq: Every day | TRANSDERMAL | Status: DC | PRN

## 2020-11-08 MED ORDER — OXYCODONE HCL 5 MG PO TABS
10.0000 mg | ORAL_TABLET | Freq: Four times a day (QID) | ORAL | Status: DC | PRN
  Administered 2020-11-08 – 2020-11-09 (×4): 10 mg via ORAL
  Filled 2020-11-08 (×4): qty 2

## 2020-11-08 MED ORDER — DIVALPROEX SODIUM 500 MG PO DR TAB
1000.0000 mg | DELAYED_RELEASE_TABLET | Freq: Two times a day (BID) | ORAL | Status: DC
  Administered 2020-11-08 – 2020-11-09 (×3): 1000 mg via ORAL
  Filled 2020-11-08 (×4): qty 2

## 2020-11-08 MED ORDER — SODIUM PHOSPHATES 45 MMOLE/15ML IV SOLN
20.0000 mmol | Freq: Once | INTRAVENOUS | Status: AC
  Administered 2020-11-08: 20 mmol via INTRAVENOUS
  Filled 2020-11-08: qty 6.67

## 2020-11-08 NOTE — Progress Notes (Signed)
PROGRESS NOTE    Jason Collier  ZOX:[id number] DOB: Feb 01, 1991 DOA: 11/07/2020 PCP: Patient, No Pcp Per (Inactive)   Chief complaint.  Shortness of breath. Brief Narrative:  Jason Collier is a 30 y.o. male with medical history significant for allergic rhinitis, depression who is admitted to [facility] on 11/07/2020 with acute bronchitis with bronchospasm after presenting from home to [facility] ED complaining of shortness of breath.  He developed hypoxemia, initially placed on 2 L oxygen, then oxygen requirement increased to 4 L.   Assessment & Plan:   Principal Problem:   Acute bronchitis with bronchospasm Active Problems:   Acute hypoxemic respiratory failure (HCC)   SOB (shortness of breath)   Tobacco abuse   Depression  #1.  Acute hypoxemic respiratory failure. Acute asthmatic bronchitis. Patient procalcitonin level less than 0.1, no bacterial infection.  No antibiotic needs.  He has significant bronchospasm.  Continue steroids. Patient still has significant bronchospasm today, not ready for discharge.  We will continue oxygen treatment and IV steroids. Patient probably can be discharged home tomorrow.  2.  Hypophosphatemia. Repleted with IV sodium phosphate. Recheck Phos level tomorrow.    DVT prophylaxis: SCDs Code Status: full Family Communication:  Disposition Plan:    Status is: Inpatient  Remains inpatient appropriate because:IV treatments appropriate due to intensity of illness or inability to take PO and Inpatient level of care appropriate due to severity of illness  Dispo: The patient is from: Home              Anticipated d/c is to: Home              Patient currently is not medically stable to d/c.   Difficult to place patient No        No intake/output data recorded. No intake/output data recorded.     Consultants:  None  Procedures: None  Antimicrobials: None  Subjective: Patient still has significant short of  breath with exertion.  Currently on 4 L oxygen. Cough, with some yellow mucus. No fever chills No dysuria hematuria    Objective: Vitals:   11/08/20 0300 11/08/20 0830 11/08/20 0900 11/08/20 1000  BP: (!) 152/69 (!) 171/86 (!) 169/92 133/62  Pulse: 84 (!) 104 (!) 112 (!) 105  Resp: (!) 21 19 19    SpO2: 96% 96% 95% 97%  Weight:      Height:       No intake or output data in the 24 hours ending 11/08/20 1150 Filed Weights   11/07/20 1903  Weight: 131.5 kg    Examination:  General exam: Appears calm and comfortable  Respiratory system: Significant decreased breathing sounds.01/07/21 Respiratory effort normal. Cardiovascular system: S1 & S2 heard, RRR. No JVD, murmurs, rubs, gallops or clicks. No pedal edema. Gastrointestinal system: Abdomen is nondistended, soft and nontender. No organomegaly or masses felt. Normal bowel sounds heard. Central nervous system: Alert and oriented. No focal neurological deficits. Extremities: Symmetric 5 x 5 power. Skin: No rashes, lesions or ulcers Psychiatry: Judgement and insight appear normal. Mood & affect appropriate.     Data Reviewed: I have personally reviewed following labs and imaging studies  CBC: Recent Labs  Lab 11/07/20 2053 11/08/20 0404  WBC 9.6 8.6  NEUTROABS 8.2*  --   HGB 15.0 14.6  HCT 43.7 43.5  MCV 81.8 81.8  PLT 251 270   Basic Metabolic Panel: Recent Labs  Lab 11/06/20 2053 11/07/20 2053 11/08/20 0404  NA  --  134* 136  K  --  4.2 4.0  CL  --  103 102  CO2  --  25 24  GLUCOSE  --  103* 215*  BUN  --  10 11  CREATININE  --  0.99 1.08  CALCIUM  --  9.7 9.7  MG 2.1  --  2.5*  PHOS  --   --  1.6*   GFR: Estimated Creatinine Clearance: 140.1 mL/min (by C-G formula based on SCr of 1.08 mg/dL). Liver Function Tests: Recent Labs  Lab 11/08/20 0404  AST 46*  ALT 46*  ALKPHOS 59  BILITOT 0.6  PROT 8.2*  ALBUMIN 4.3   No results for input(s): LIPASE, AMYLASE in the last 168 hours. No results for  input(s): AMMONIA in the last 168 hours. Coagulation Profile: No results for input(s): INR, PROTIME in the last 168 hours. Cardiac Enzymes: No results for input(s): CKTOTAL, CKMB, CKMBINDEX, TROPONINI in the last 168 hours. BNP (last 3 results) No results for input(s): PROBNP in the last 8760 hours. HbA1C: No results for input(s): HGBA1C in the last 72 hours. CBG: No results for input(s): GLUCAP in the last 168 hours. Lipid Profile: No results for input(s): CHOL, HDL, LDLCALC, TRIG, CHOLHDL, LDLDIRECT in the last 72 hours. Thyroid Function Tests: No results for input(s): TSH, T4TOTAL, FREET4, T3FREE, THYROIDAB in the last 72 hours. Anemia Panel: No results for input(s): VITAMINB12, FOLATE, FERRITIN, TIBC, IRON, RETICCTPCT in the last 72 hours. Sepsis Labs: Recent Labs  Lab 11/08/20 0404  PROCALCITON <0.10    Recent Results (from the past 240 hour(s))  Resp Panel by RT-PCR (Flu A&B, Covid) Nasopharyngeal Swab     Status: None   Collection Time: 11/07/20  8:14 PM   Specimen: Nasopharyngeal Swab; Nasopharyngeal(NP) swabs in vial transport medium  Result Value Ref Range Status   SARS Coronavirus 2 by RT PCR NEGATIVE NEGATIVE Final    Comment: (NOTE) SARS-CoV-2 target nucleic acids are NOT DETECTED.  The SARS-CoV-2 RNA is generally detectable in upper respiratory specimens during the acute phase of infection. The lowest concentration of SARS-CoV-2 viral copies this assay can detect is 138 copies/mL. A negative result does not preclude SARS-Cov-2 infection and should not be used as the sole basis for treatment or other patient management decisions. A negative result may occur with  improper specimen collection/handling, submission of specimen other than nasopharyngeal swab, presence of viral mutation(s) within the areas targeted by this assay, and inadequate number of viral copies(<138 copies/mL). A negative result must be combined with clinical observations, patient history, and  epidemiological information. The expected result is Negative.  Fact Sheet for Patients:  BloggerCourse.com  Fact Sheet for Healthcare Providers:  SeriousBroker.it  This test is no t yet approved or cleared by the [place] FDA and  has been authorized for detection and/or diagnosis of SARS-CoV-2 by FDA under an Emergency Use Authorization (EUA). This EUA will remain  in effect (meaning this test can be used) for the duration of the COVID-19 declaration under Section 564(b)(1) of the Act, 21 U.S.C.section 360bbb-3(b)(1), unless the authorization is terminated  or revoked sooner.       Influenza A by PCR NEGATIVE NEGATIVE Final   Influenza B by PCR NEGATIVE NEGATIVE Final    Comment: (NOTE) The Xpert Xpress SARS-CoV-2/FLU/RSV plus assay is intended as an aid in the diagnosis of influenza from Nasopharyngeal swab specimens and should not be used as a sole basis for treatment. Nasal washings and aspirates are unacceptable for Xpert Xpress SARS-CoV-2/FLU/RSV testing.  Fact Sheet for Patients: BloggerCourse.com  Fact Sheet for Healthcare Providers: SeriousBroker.it  This test is not yet approved or cleared by the [place] FDA and has been authorized for detection and/or diagnosis of SARS-CoV-2 by FDA under an Emergency Use Authorization (EUA). This EUA will remain in effect (meaning this test can be used) for the duration of the COVID-19 declaration under Section 564(b)(1) of the Act, 21 U.S.C. section 360bbb-3(b)(1), unless the authorization is terminated or revoked.  Performed at [facility], [address], [place], [place] [address]          Radiology Studies: DG Chest Portable 1 View  Result Date: 11/07/2020 CLINICAL DATA:  Shortness of breath EXAM: PORTABLE CHEST 1 VIEW COMPARISON:  03/20/2016 FINDINGS: Cardiac shadow is within normal limits. The  lungs are well aerated bilaterally. Mild bibasilar atelectatic changes are seen. No focal confluent infiltrate is noted. No bony abnormality is seen. IMPRESSION: Mild bibasilar atelectasis. Electronically Signed   By: Alcide Clever M.D.   On: 11/07/2020 19:53        Scheduled Meds:  divalproex  1,000 mg Oral Q12H   ipratropium-albuterol  3 mL Nebulization Q6H   methylPREDNISolone (SOLU-MEDROL) injection  40 mg Intravenous Q6H   Continuous Infusions:  sodium phosphate  Dextrose 5% IVPB 20 mmol (11/08/20 0946)     LOS: 1 day    Time spent: 27 minutes    Marrion Coy, MD [facility]   To contact the attending provider between 7A-7P or the covering provider during after hours 7P-7A, please log into the web site [email or web address] and access using universal [place] password for that web site. If you do not have the password, please call the hospital operator.  11/08/2020, 11:50 AM

## 2020-11-09 LAB — MAGNESIUM: Magnesium: 2.3 mg/dL (ref 1.7–2.4)

## 2020-11-09 LAB — BASIC METABOLIC PANEL
Anion gap: 5 (ref 5–15)
BUN: 14 mg/dL (ref 6–20)
CO2: 28 mmol/L (ref 22–32)
Calcium: 9.4 mg/dL (ref 8.9–10.3)
Chloride: 104 mmol/L (ref 98–111)
Creatinine, Ser: 0.99 mg/dL (ref 0.61–1.24)
GFR, Estimated: >60 mL/min (ref >60)
Glucose, Bld: 144 mg/dL — ABNORMAL HIGH (ref 70–99)
Potassium: 5.2 mmol/L — ABNORMAL HIGH (ref 3.5–5.1)
Sodium: 137 mmol/L (ref 135–145)

## 2020-11-09 LAB — POTASSIUM: Potassium: 4.7 mmol/L (ref 3.5–5.1)

## 2020-11-09 LAB — PHOSPHORUS: Phosphorus: 3.4 mg/dL (ref 2.5–4.6)

## 2020-11-09 MED ORDER — DICYCLOMINE HCL 20 MG PO TABS
20.0000 mg | ORAL_TABLET | Freq: Two times a day (BID) | ORAL | Status: DC | PRN
  Administered 2020-11-09: 20 mg via ORAL
  Filled 2020-11-09 (×2): qty 1

## 2020-11-09 MED ORDER — PREDNISONE 10 MG PO TABS: ORAL_TABLET | ORAL | 0 refills | Status: AC

## 2020-11-09 MED ORDER — ONDANSETRON HCL 4 MG/2ML IJ SOLN
4.0000 mg | Freq: Four times a day (QID) | INTRAMUSCULAR | Status: DC | PRN
  Administered 2020-11-09: 4 mg via INTRAVENOUS
  Filled 2020-11-09: qty 2

## 2020-11-09 MED ORDER — ADVAIR HFA 115-21 MCG/ACT IN AERO: 2.0000 | INHALATION_SPRAY | Freq: Two times a day (BID) | RESPIRATORY_TRACT | 0 refills | Status: AC

## 2020-11-09 MED ORDER — COMBIVENT RESPIMAT 20-100 MCG/ACT IN AERS: 1.0000 | INHALATION_SPRAY | Freq: Four times a day (QID) | RESPIRATORY_TRACT | 9 refills | Status: AC | PRN

## 2020-11-09 MED ORDER — NICOTINE 14 MG/24HR TD PT24: 14.0000 mg | MEDICATED_PATCH | Freq: Every day | TRANSDERMAL | 0 refills | Status: AC | PRN

## 2020-11-09 NOTE — ED Notes (Signed)
Pt transported to the floor by this RN monitored in a hospital bed with 3L [place].

## 2020-11-09 NOTE — ED Notes (Signed)
Dr. Para March notified of the patients nausea and abdominal discomfort after a BM. View MAR for intervention.

## 2020-11-09 NOTE — Plan of Care (Signed)

## 2020-11-09 NOTE — ED Notes (Signed)
Pt ambulated to the restroom with portable O2 with the assistance of this RN.

## 2020-11-09 NOTE — Discharge Summary (Addendum)
Physician Discharge Summary  Patient ID: Jason Collier MRN: [id number] DOB/AGE: Oct 26, 1990 30 y.o.  Admit date: 11/07/2020 Discharge date: 11/09/2020  Admission Diagnoses:  Discharge Diagnoses:  Principal Problem:   Acute bronchitis with bronchospasm Active Problems:   Acute hypoxemic respiratory failure (HCC)   SOB (shortness of breath)   Tobacco abuse   Depression   Hypophosphatasia Morbid obesity.  Discharged Condition: good  Hospital Course:  Jason Collier is a 30 y.o. male with medical history significant for allergic rhinitis, depression who is admitted to [facility] on 11/07/2020 with acute bronchitis with bronchospasm after presenting from home to [facility] ED complaining of shortness of breath.  He developed hypoxemia, initially placed on 2 L oxygen, then oxygen requirement increased to 4 L.  #1.  Acute hypoxemic respiratory failure. Acute asthmatic bronchitis. COPD exacerbation Patient procalcitonin level less than 0.1, no bacterial infection.  No antibiotic needs.  He had a significant bronchospasm, he was given bronchodilator and IV steroids.  He has significant decreased breathing sounds suggesting emphysema with COPD. At this point, patient condition had improved, currently he is on 2 L oxygen with good saturation.  He does not not feel any short of breath.  I will obtain home oxygen evaluation.  He is wean off oxygen or a short course of oxygen. Patient will be followed by pulmonology in 2 weeks, will also talk to social worker to arrange for PCP follow-up in 1 week.  2.  Hypophosphatemia. Repleted with IV sodium phosphate. Repeated Phos level was normal.   Consults: None  Significant Diagnostic Studies:  PORTABLE CHEST 1 VIEW   COMPARISON:  03/20/2016   FINDINGS: Cardiac shadow is within normal limits. The lungs are well aerated bilaterally. Mild bibasilar atelectatic changes are seen. No focal confluent infiltrate is noted. No bony  abnormality is seen.   IMPRESSION: Mild bibasilar atelectasis.     Electronically Signed   By: Inez Catalina M.D.   On: 11/07/2020 19:53   Treatments: IV steroids, oxygen  Discharge Exam: Blood pressure (!) 147/89, pulse 83, temperature 98.1 F (36.7 C), temperature source Oral, resp. rate 17, height '5\' 10"'  (1.778 m), weight (!) 145.1 kg, SpO2 97 %. General appearance: alert and cooperative Resp: Significant decreased breathing sounds without crackles or wheezes. Cardio: regular rate and rhythm, S1, S2 normal, no murmur, click, rub or gallop GI: soft, non-tender; bowel sounds normal; no masses,  no organomegaly Extremities: extremities normal, atraumatic, no cyanosis or edema  Disposition: Discharge disposition: 01-Home or Self Care      Discharge Instructions     Diet - low sodium heart healthy   Complete by: As directed    Increase activity slowly   Complete by: As directed       Allergies as of 11/09/2020       Reactions   Tramadol Itching   Hydrocodone Nausea And Vomiting, Nausea Only, Swelling   Dexamethasone Nausea Only   Escitalopram Other (See Comments)   Made him feel agitated   Hydrocodone Nausea And Vomiting   Hydroxyzine Hcl Other (See Comments)   Made him feel more anxious   Methocarbamol    Other reaction(s): Headache   Montelukast    Enhances Schizophrenia   Sertraline Other (See Comments)   sleepy and agitated  Enhances Schizophrenia    Tramadol Itching   Fentanyl Diarrhea, Nausea And Vomiting   Quetiapine Other (See Comments)   Fatigue Per pt Increased drowsiness        Medication List  STOP taking these medications    gabapentin 300 MG capsule Commonly known as: NEURONTIN   Semaglutide(0.25 or 0.5MG/DOS) 2 MG/1.5ML Sopn       TAKE these medications    Advair HFA 115-21 MCG/ACT inhaler Generic drug: fluticasone-salmeterol Inhale 2 puffs into the lungs 2 (two) times daily.   Combivent Respimat 20-100 MCG/ACT Aers  respimat Generic drug: Ipratropium-Albuterol Inhale 1 puff into the lungs every 6 (six) hours as needed for wheezing.   divalproex 500 MG DR tablet Commonly known as: DEPAKOTE 2 po in am and 2 po in pm   naloxone 4 MG/0.1ML Liqd nasal spray kit Commonly known as: NARCAN Place 4 mg into the nose once.   nicotine 14 mg/24hr patch Commonly known as: NICODERM CQ - dosed in mg/24 hours Place 1 patch (14 mg total) onto the skin daily as needed (smoking cessation).   oxyCODONE-acetaminophen 10-325 MG tablet Commonly known as: PERCOCET Take 1 tablet by mouth every 4 (four) hours as needed for pain.   predniSONE 10 MG tablet Commonly known as: DELTASONE Take 4 tablets (40 mg total) by mouth daily for 3 days, THEN 2 tablets (20 mg total) daily for 3 days, THEN 1 tablet (10 mg total) daily for 3 days. Start taking on: November 09, 2020        Follow-up Information     Ottie Glazier, MD Follow up on 11/23/2020.   Specialty: Pulmonary Disease Why: @ 1:30pm Contact information: [place] [address] [phone]                 Signed: Sharen Hones 11/09/2020, 12:23 PM

## 2020-12-06 ENCOUNTER — Ambulatory Visit: Payer: Medicaid Other | Attending: Internal Medicine

## 2020-12-06 ENCOUNTER — Other Ambulatory Visit: Admission: RE | Admit: 2020-12-06 | Payer: Medicaid Other | Source: Ambulatory Visit

## 2020-12-15 ENCOUNTER — Other Ambulatory Visit: Admission: RE | Admit: 2020-12-15 | Payer: Medicaid Other | Source: Ambulatory Visit

## 2020-12-16 ENCOUNTER — Ambulatory Visit: Payer: Medicaid Other

## 2021-02-14 ENCOUNTER — Observation Stay: Payer: Medicaid Other

## 2021-02-14 ENCOUNTER — Inpatient Hospital Stay
Admission: EM | Admit: 2021-02-14 | Discharge: 2021-02-16 | DRG: 193 | Disposition: A | Discharge status: Home / Self Care | Payer: Medicaid Other | Attending: Internal Medicine | Admitting: Internal Medicine

## 2021-02-14 ENCOUNTER — Emergency Department: Payer: Medicaid Other

## 2021-02-14 ENCOUNTER — Other Ambulatory Visit: Payer: Self-pay

## 2021-02-14 ENCOUNTER — Encounter: Payer: Self-pay | Admitting: *Deleted

## 2021-02-14 DIAGNOSIS — J1008 Influenza due to other identified influenza virus with other specified pneumonia: Secondary | ICD-10-CM | POA: Diagnosis present

## 2021-02-14 DIAGNOSIS — J129 Viral pneumonia, unspecified: Secondary | ICD-10-CM | POA: Diagnosis present

## 2021-02-14 DIAGNOSIS — Z9981 Dependence on supplemental oxygen: Secondary | ICD-10-CM | POA: Diagnosis not present

## 2021-02-14 DIAGNOSIS — J4551 Severe persistent asthma with (acute) exacerbation: Secondary | ICD-10-CM | POA: Diagnosis present

## 2021-02-14 DIAGNOSIS — Z79899 Other long term (current) drug therapy: Secondary | ICD-10-CM

## 2021-02-14 DIAGNOSIS — F319 Bipolar disorder, unspecified: Secondary | ICD-10-CM | POA: Diagnosis present

## 2021-02-14 DIAGNOSIS — Z7951 Long term (current) use of inhaled steroids: Secondary | ICD-10-CM

## 2021-02-14 DIAGNOSIS — Z888 Allergy status to other drugs, medicaments and biological substances status: Secondary | ICD-10-CM

## 2021-02-14 DIAGNOSIS — Z6841 Body Mass Index (BMI) 40.0 and over, adult: Secondary | ICD-10-CM | POA: Diagnosis not present

## 2021-02-14 DIAGNOSIS — J9621 Acute and chronic respiratory failure with hypoxia: Secondary | ICD-10-CM | POA: Diagnosis present

## 2021-02-14 DIAGNOSIS — T380X5A Adverse effect of glucocorticoids and synthetic analogues, initial encounter: Secondary | ICD-10-CM | POA: Diagnosis not present

## 2021-02-14 DIAGNOSIS — Z20822 Contact with and (suspected) exposure to covid-19: Secondary | ICD-10-CM | POA: Diagnosis present

## 2021-02-14 DIAGNOSIS — F419 Anxiety disorder, unspecified: Secondary | ICD-10-CM | POA: Diagnosis present

## 2021-02-14 DIAGNOSIS — Z716 Tobacco abuse counseling: Secondary | ICD-10-CM

## 2021-02-14 DIAGNOSIS — Z885 Allergy status to narcotic agent status: Secondary | ICD-10-CM | POA: Diagnosis not present

## 2021-02-14 DIAGNOSIS — F17210 Nicotine dependence, cigarettes, uncomplicated: Secondary | ICD-10-CM | POA: Diagnosis present

## 2021-02-14 DIAGNOSIS — J9600 Acute respiratory failure, unspecified whether with hypoxia or hypercapnia: Secondary | ICD-10-CM | POA: Diagnosis present

## 2021-02-14 DIAGNOSIS — G90512 Complex regional pain syndrome I of left upper limb: Secondary | ICD-10-CM | POA: Diagnosis present

## 2021-02-14 DIAGNOSIS — J440 Chronic obstructive pulmonary disease with acute lower respiratory infection: Secondary | ICD-10-CM | POA: Diagnosis present

## 2021-02-14 DIAGNOSIS — J45909 Unspecified asthma, uncomplicated: Secondary | ICD-10-CM

## 2021-02-14 DIAGNOSIS — J101 Influenza due to other identified influenza virus with other respiratory manifestations: Secondary | ICD-10-CM | POA: Diagnosis present

## 2021-02-14 DIAGNOSIS — J1100 Influenza due to unidentified influenza virus with unspecified type of pneumonia: Secondary | ICD-10-CM | POA: Diagnosis present

## 2021-02-14 DIAGNOSIS — R400 Somnolence: Secondary | ICD-10-CM

## 2021-02-14 DIAGNOSIS — J9601 Acute respiratory failure with hypoxia: Secondary | ICD-10-CM | POA: Diagnosis not present

## 2021-02-14 DIAGNOSIS — Z720 Tobacco use: Secondary | ICD-10-CM | POA: Diagnosis not present

## 2021-02-14 DIAGNOSIS — J302 Other seasonal allergic rhinitis: Secondary | ICD-10-CM | POA: Diagnosis present

## 2021-02-14 DIAGNOSIS — E871 Hypo-osmolality and hyponatremia: Secondary | ICD-10-CM | POA: Diagnosis present

## 2021-02-14 DIAGNOSIS — J441 Chronic obstructive pulmonary disease with (acute) exacerbation: Secondary | ICD-10-CM | POA: Insufficient documentation

## 2021-02-14 HISTORY — DX: Unspecified asthma, uncomplicated: J45.909

## 2021-02-14 LAB — BLOOD GAS, VENOUS
Acid-Base Excess: 1.6 mmol/L (ref 0.0–2.0)
Bicarbonate: 26.6 mmol/L (ref 20.0–28.0)
O2 Saturation: 87.4 %
Patient temperature: 37
pCO2, Ven: 42 mmHg — ABNORMAL LOW (ref 44.0–60.0)
pH, Ven: 7.41 (ref 7.250–7.430)
pO2, Ven: 53 mmHg — ABNORMAL HIGH (ref 32.0–45.0)

## 2021-02-14 LAB — CBC WITH DIFFERENTIAL/PLATELET
Abs Immature Granulocytes: 0.03 10*3/uL (ref 0.00–0.07)
Basophils Absolute: 0 10*3/uL (ref 0.0–0.1)
Basophils Relative: 0 %
Eosinophils Absolute: 0 10*3/uL (ref 0.0–0.5)
Eosinophils Relative: 0 %
HCT: 45.7 % (ref 39.0–52.0)
Hemoglobin: 15.2 g/dL (ref 13.0–17.0)
Immature Granulocytes: 0 %
Lymphocytes Relative: 15 %
Lymphs Abs: 1.3 10*3/uL (ref 0.7–4.0)
MCH: 27 pg (ref 26.0–34.0)
MCHC: 33.3 g/dL (ref 30.0–36.0)
MCV: 81.2 fL (ref 80.0–100.0)
Monocytes Absolute: 0.6 10*3/uL (ref 0.1–1.0)
Monocytes Relative: 6 %
Neutro Abs: 7 10*3/uL (ref 1.7–7.7)
Neutrophils Relative %: 79 %
Platelets: 245 10*3/uL (ref 150–400)
RBC: 5.63 MIL/uL (ref 4.22–5.81)
RDW: 15.1 % (ref 11.5–15.5)
WBC: 8.9 10*3/uL (ref 4.0–10.5)
nRBC: 0 % (ref 0.0–0.2)

## 2021-02-14 LAB — COMPREHENSIVE METABOLIC PANEL
ALT: 32 U/L (ref 0–44)
AST: 26 U/L (ref 15–41)
Albumin: 4.2 g/dL (ref 3.5–5.0)
Alkaline Phosphatase: 59 U/L (ref 38–126)
Anion gap: 8 (ref 5–15)
BUN: 11 mg/dL (ref 6–20)
CO2: 25 mmol/L (ref 22–32)
Calcium: 9.2 mg/dL (ref 8.9–10.3)
Chloride: 101 mmol/L (ref 98–111)
Creatinine, Ser: 1.02 mg/dL (ref 0.61–1.24)
GFR, Estimated: >60 mL/min (ref >60)
Glucose, Bld: 109 mg/dL — ABNORMAL HIGH (ref 70–99)
Potassium: 3.6 mmol/L (ref 3.5–5.1)
Sodium: 134 mmol/L — ABNORMAL LOW (ref 135–145)
Total Bilirubin: 0.8 mg/dL (ref 0.3–1.2)
Total Protein: 7.6 g/dL (ref 6.5–8.1)

## 2021-02-14 LAB — LACTIC ACID, PLASMA
Lactic Acid, Venous: 1.2 mmol/L (ref 0.5–1.9)
Lactic Acid, Venous: 1.6 mmol/L (ref 0.5–1.9)

## 2021-02-14 LAB — RESP PANEL BY RT-PCR (FLU A&B, COVID) ARPGX2
Influenza A by PCR: POSITIVE — AB
Influenza B by PCR: NEGATIVE
SARS Coronavirus 2 by RT PCR: NEGATIVE

## 2021-02-14 LAB — LIPASE, BLOOD: Lipase: 21 U/L (ref 11–51)

## 2021-02-14 LAB — PROCALCITONIN: Procalcitonin: <0.1 ng/mL

## 2021-02-14 MED ORDER — ONDANSETRON HCL 4 MG PO TABS: 4.0000 mg | ORAL_TABLET | Freq: Four times a day (QID) | ORAL | Status: DC | PRN

## 2021-02-14 MED ORDER — DIVALPROEX SODIUM 500 MG PO DR TAB
1500.0000 mg | DELAYED_RELEASE_TABLET | Freq: Two times a day (BID) | ORAL | Status: DC
  Administered 2021-02-14 – 2021-02-16 (×5): 1500 mg via ORAL
  Filled 2021-02-14 (×6): qty 3

## 2021-02-14 MED ORDER — METHYLPREDNISOLONE SODIUM SUCC 125 MG IJ SOLR
125.0000 mg | Freq: Once | INTRAMUSCULAR | Status: AC
  Administered 2021-02-14: 125 mg via INTRAVENOUS
  Filled 2021-02-14: qty 2

## 2021-02-14 MED ORDER — ENOXAPARIN SODIUM 80 MG/0.8ML IJ SOSY
0.5000 mg/kg | PREFILLED_SYRINGE | INTRAMUSCULAR | Status: DC
  Administered 2021-02-14 – 2021-02-15 (×2): 72.5 mg via SUBCUTANEOUS
  Filled 2021-02-14 (×3): qty 0.72

## 2021-02-14 MED ORDER — NICOTINE 14 MG/24HR TD PT24
14.0000 mg | MEDICATED_PATCH | Freq: Every day | TRANSDERMAL | Status: DC | PRN
  Administered 2021-02-14 – 2021-02-15 (×2): 14 mg via TRANSDERMAL
  Filled 2021-02-14 (×2): qty 1

## 2021-02-14 MED ORDER — OXYCODONE-ACETAMINOPHEN 10-325 MG PO TABS: 1.0000 | ORAL_TABLET | Freq: Three times a day (TID) | ORAL | Status: DC | PRN

## 2021-02-14 MED ORDER — IPRATROPIUM-ALBUTEROL 0.5-2.5 (3) MG/3ML IN SOLN
3.0000 mL | Freq: Once | RESPIRATORY_TRACT | Status: AC
  Administered 2021-02-14: 3 mL via RESPIRATORY_TRACT
  Filled 2021-02-14: qty 3

## 2021-02-14 MED ORDER — SODIUM CHLORIDE 0.9% FLUSH: 3.0000 mL | INTRAVENOUS | Status: DC | PRN

## 2021-02-14 MED ORDER — ALBUTEROL SULFATE (2.5 MG/3ML) 0.083% IN NEBU: 2.5000 mg | INHALATION_SOLUTION | RESPIRATORY_TRACT | Status: DC | PRN

## 2021-02-14 MED ORDER — METHYLPREDNISOLONE SODIUM SUCC 40 MG IJ SOLR
40.0000 mg | Freq: Two times a day (BID) | INTRAMUSCULAR | Status: AC
  Administered 2021-02-14 – 2021-02-15 (×2): 40 mg via INTRAVENOUS
  Filled 2021-02-14 (×2): qty 1

## 2021-02-14 MED ORDER — ACETAMINOPHEN 650 MG RE SUPP: 650.0000 mg | Freq: Four times a day (QID) | RECTAL | Status: DC | PRN

## 2021-02-14 MED ORDER — SODIUM CHLORIDE 0.9 % IV SOLN: INTRAVENOUS | Status: DC

## 2021-02-14 MED ORDER — SODIUM CHLORIDE 0.9 % IV SOLN
2.0000 g | INTRAVENOUS | Status: DC
  Administered 2021-02-14: 2 g via INTRAVENOUS
  Filled 2021-02-14: qty 20

## 2021-02-14 MED ORDER — IOHEXOL 350 MG/ML SOLN
80.0000 mL | Freq: Once | INTRAVENOUS | Status: AC | PRN
  Administered 2021-02-14: 80 mL via INTRAVENOUS

## 2021-02-14 MED ORDER — SODIUM CHLORIDE 0.9 % IV SOLN
500.0000 mg | INTRAVENOUS | Status: DC
  Administered 2021-02-14: 500 mg via INTRAVENOUS
  Filled 2021-02-14 (×2): qty 500

## 2021-02-14 MED ORDER — SODIUM CHLORIDE 0.9% FLUSH
3.0000 mL | Freq: Two times a day (BID) | INTRAVENOUS | Status: DC
  Administered 2021-02-14 – 2021-02-16 (×5): 3 mL via INTRAVENOUS

## 2021-02-14 MED ORDER — MOMETASONE FURO-FORMOTEROL FUM 200-5 MCG/ACT IN AERO
2.0000 | INHALATION_SPRAY | Freq: Two times a day (BID) | RESPIRATORY_TRACT | Status: DC
  Administered 2021-02-14 – 2021-02-16 (×4): 2 via RESPIRATORY_TRACT
  Filled 2021-02-14 (×2): qty 8.8

## 2021-02-14 MED ORDER — ACETAMINOPHEN 325 MG PO TABS
650.0000 mg | ORAL_TABLET | Freq: Four times a day (QID) | ORAL | Status: DC | PRN
  Administered 2021-02-14: 650 mg via ORAL
  Filled 2021-02-14: qty 2

## 2021-02-14 MED ORDER — IPRATROPIUM-ALBUTEROL 0.5-2.5 (3) MG/3ML IN SOLN
3.0000 mL | Freq: Four times a day (QID) | RESPIRATORY_TRACT | Status: DC
  Administered 2021-02-14 – 2021-02-16 (×9): 3 mL via RESPIRATORY_TRACT
  Filled 2021-02-14 (×8): qty 3

## 2021-02-14 MED ORDER — OSELTAMIVIR PHOSPHATE 75 MG PO CAPS
75.0000 mg | ORAL_CAPSULE | Freq: Two times a day (BID) | ORAL | Status: DC
  Administered 2021-02-14 – 2021-02-16 (×5): 75 mg via ORAL
  Filled 2021-02-14 (×6): qty 1

## 2021-02-14 MED ORDER — PREDNISONE 20 MG PO TABS
40.0000 mg | ORAL_TABLET | Freq: Every day | ORAL | Status: DC
  Administered 2021-02-16: 09:00:00 40 mg via ORAL
  Filled 2021-02-14: qty 2

## 2021-02-14 MED ORDER — LIDOCAINE 5 % EX PTCH
1.0000 | MEDICATED_PATCH | CUTANEOUS | Status: DC
Start: 2021-02-14 — End: 2021-02-16
  Administered 2021-02-14 – 2021-02-15 (×2): 1 via TRANSDERMAL
  Filled 2021-02-14 (×3): qty 1

## 2021-02-14 MED ORDER — ONDANSETRON HCL 4 MG/2ML IJ SOLN: 4.0000 mg | Freq: Four times a day (QID) | INTRAMUSCULAR | Status: DC | PRN

## 2021-02-14 MED ORDER — SODIUM CHLORIDE 0.9 % IV SOLN: 250.0000 mL | INTRAVENOUS | Status: DC | PRN

## 2021-02-14 MED ORDER — ENOXAPARIN SODIUM 40 MG/0.4ML IJ SOSY: 40.0000 mg | PREFILLED_SYRINGE | INTRAMUSCULAR | Status: DC

## 2021-02-14 NOTE — ED Provider Notes (Signed)
[facility] Emergency Department Provider Note  ____________________________________________   Event Date/Time   First MD Initiated Contact with Patient 02/14/21 0301     (approximate)  I have reviewed the triage vital signs and the nursing notes.   HISTORY  Chief Complaint Cough  Level 5 caveat:  history/ROS limited by acute/critical illness  HPI Jason Collier is a 30 y.o. male with a history of obesity and chronic respiratory failure thought to be due to COPD and continued use of tobacco.  He presents by EMS for evaluation of fever, cough, and shortness of breath that has been gradually worsening x2 days and has become severe.  He said he feels like he cannot breathe.  He does not normally have an oxygen requirement but his oxygen saturation was in the upper 80s by EMS and he was placed on 4 L of oxygen upon arrival in the ED.  He is not able to provide much history because he keeps falling asleep.  His mother presented to bedside soon after his arrival and said that he has run out of his albuterol at home.  He has started smoking again after a brief period during which he quit.  He has not had any nausea or vomiting but has not been feeling well enough to do much of anything and has not been eating or drinking.   Past Medical History:  Diagnosis Date   Anxiety    CRPS (complex regional pain syndrome type I)    Environmental and seasonal allergies    can lead to brochitis   Kidney infection 2015   history of    Patient Active Problem List   Diagnosis Date Noted   COPD with acute exacerbation (HCC) 02/14/2021   SOB (shortness of breath) 11/08/2020   Tobacco abuse 11/08/2020   Depression 11/08/2020   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   Acute bronchitis with bronchospasm 11/07/2020   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   Panic attacks 01/28/2017   Benzodiazepine overdose 01/28/2017   Acute hypoxemic respiratory failure (HCC)  03/19/2016   Encounter for orogastric (OG) tube placement    Aspiration into airway    Neck mass     Past Surgical History:  Procedure Laterality Date   KNEE ARTHROSCOPY WITH LATERAL MENISECTOMY Right 07/26/2015   Procedure: KNEE ARTHROSCOPY WITH LATERAL MENISECTOMY, DEBRIDEMENT OF FAT PAD;  Surgeon: Kennedy Bucker, MD;  Location: [facility] ORS;  Service: Orthopedics;  Laterality: Right;   NO PAST SURGERIES      Prior to Admission medications   Medication Sig Start Date End Date Taking? Authorizing Provider  divalproex (DEPAKOTE) 500 MG DR tablet 2 po in am and 2 po in pm 06/12/18   [provider]  fluticasone-salmeterol (ADVAIR HFA) 115-21 MCG/ACT inhaler Inhale 2 puffs into the lungs 2 (two) times daily. 11/09/20   Marrion Coy, MD  Ipratropium-Albuterol (COMBIVENT RESPIMAT) 20-100 MCG/ACT AERS respimat Inhale 1 puff into the lungs every 6 (six) hours as needed for wheezing. 11/09/20   Marrion Coy, MD  naloxone [facility]) nasal spray 4 mg/0.1 mL Place 4 mg into the nose once. 06/07/20   [provider]  nicotine (NICODERM CQ - DOSED IN MG/24 HOURS) 14 mg/24hr patch Place 1 patch (14 mg total) onto the skin daily as needed (smoking cessation). 11/09/20   Marrion Coy, MD  oxyCODONE-acetaminophen (PERCOCET) 10-325 MG tablet Take 1 tablet by mouth every 4 (four) hours as needed for pain. 09/07/20   [provider]  clonazePAM Scarlette Calico) 1  MG tablet Take 1 mg by mouth at bedtime as needed for anxiety.  02/11/20  [provider]  CLONIDINE HCL PO Take 1 tablet by mouth 2 (two) times daily as needed (anxiety). Reported on 07/26/2015  08/23/19  [provider]  FLUoxetine (PROZAC) 20 MG tablet Take 20 mg by mouth daily.  08/23/19  [provider]  QUEtiapine (SEROQUEL) 50 MG tablet Take 2 tablets (100 mg total) by mouth at bedtime. Take half pill up to twice a day as needed for anxiety.  Take 2 pills regularly every night for sleep and mood. 01/28/17 08/23/19   Clapacs, Jackquline Denmark, MD  sertraline (ZOLOFT) 50 MG tablet Take 50 mg by mouth daily. Reported on 07/26/2015  08/23/19  [provider]    Allergies Tramadol, Hydrocodone, Dexamethasone, Escitalopram, Hydrocodone, Hydroxyzine hcl, Methocarbamol, Montelukast, Sertraline, Tramadol, Fentanyl, and Quetiapine  Family History  Problem Relation Age of Onset   Anxiety disorder Mother     Social History Social History   Tobacco Use   Smoking status: Every Day    Packs/day: 0.50    Types: Cigarettes   Smokeless tobacco: Former  Building services engineer Use: Never used  Substance Use Topics   Alcohol use: Yes    Alcohol/week: 2.0 standard drinks    Types: 2 Cans of beer per week    Comment: every three days   Drug use: No    Types: Marijuana    Comment: over 1 year ago    Review of Systems Level 5 caveat:  history/ROS limited by acute/critical illness  ____________________________________________   PHYSICAL EXAM:  VITAL SIGNS: ED Triage Vitals  Enc Vitals Group     BP 02/14/21 0247 (!) 166/98     Pulse Rate 02/14/21 0247 (!) 106     Resp 02/14/21 0247 (!) 40     Temp 02/14/21 0247 98.5 F (36.9 C)     Temp Source 02/14/21 0247 Oral     SpO2 02/14/21 0247 90 %     Weight 02/14/21 0245 (!) 144.7 kg (319 lb)     Height 02/14/21 0245 1.778 m (5\' 10" )     Head Circumference --      Peak Flow --      Pain Score 02/14/21 0244 10     Pain Loc --      Pain Edu? --      Excl. in GC? --     Constitutional: Somnolent.  Wakes up to loud voice and light touch. Eyes: Conjunctivae are normal.  Head: Atraumatic. Nose: +congestion/rhinnorhea. Mouth/Throat: Patient is wearing a mask. Neck: No stridor.  No meningeal signs.   Cardiovascular: Tachycardia, regular rhythm. Good peripheral circulation. Respiratory: Increased respiratory rate with accessory muscle usage, occasional cough wheezing throughout lung fields, and coarse breath sounds. Gastrointestinal: Obese.  Soft and  nontender. No distention.  Musculoskeletal: No lower extremity tenderness nor edema. No gross deformities of extremities. Neurologic: Abnormal due to somnolence bordering on obtundation, however there is no evidence of a focal neurological deficits such as facial droop or focal numbness/weakness in his extremities.  However he is not able to participate fully and in neurological exam because he falls back to sleep. Skin:  Skin is warm, dry and intact.   ____________________________________________   LABS (all labs ordered are listed, but only abnormal results are displayed)  Labs Reviewed  RESP PANEL BY RT-PCR (FLU A&B, COVID) ARPGX2 - Abnormal; Notable for the following components:      Result Value  Influenza A by PCR POSITIVE (*)    All other components within normal limits  COMPREHENSIVE METABOLIC PANEL - Abnormal; Notable for the following components:   Sodium 134 (*)    Glucose, Bld 109 (*)    All other components within normal limits  BLOOD GAS, VENOUS - Abnormal; Notable for the following components:   pCO2, Ven 42 (*)    pO2, Ven 53.0 (*)    All other components within normal limits  CULTURE, BLOOD (SINGLE)  LACTIC ACID, PLASMA  CBC WITH DIFFERENTIAL/PLATELET  LIPASE, BLOOD  LACTIC ACID, PLASMA   ____________________________________________  EKG  ED ECG REPORT I, Loleta Rose, the attending physician, personally viewed and interpreted this ECG.  Date: 02/14/2021 EKG Time: 6:42 AM Rate: 98 Rhythm: normal sinus rhythm QRS Axis: Borderline right axis deviation Intervals: normal ST/T Wave abnormalities: Non-specific ST segment / T-wave changes, but no clear evidence of acute ischemia. Narrative Interpretation: no definitive evidence of acute ischemia; does not meet STEMI criteria.  ____________________________________________  RADIOLOGY I, Loleta Rose, personally viewed and evaluated these images (plain radiographs) as part of my medical decision making, as  well as reviewing the written report by the radiologist.  ED MD interpretation: No obvious abnormality on chest x-ray  Official radiology report(s): DG Chest Port 1 View  Result Date: 02/14/2021 CLINICAL DATA:  Possible sepsis EXAM: PORTABLE CHEST 1 VIEW COMPARISON:  11/07/2020 FINDINGS: The heart size and mediastinal contours are within normal limits. Both lungs are clear. The visualized skeletal structures are unremarkable. IMPRESSION: No active disease. Electronically Signed   By: Alcide Clever M.D.   On: 02/14/2021 03:05    ____________________________________________   PROCEDURES   Procedure(s) performed (including Critical Care):  .1-3 Lead EKG Interpretation Performed by: Loleta Rose, MD Authorized by: Loleta Rose, MD     Interpretation: abnormal     ECG rate:  106   ECG rate assessment: tachycardic     Rhythm: sinus tachycardia     Ectopy: none     Conduction: normal   .Critical Care Performed by: Loleta Rose, MD Authorized by: Loleta Rose, MD   Critical care provider statement:    Critical care time (minutes):  30   Critical care time was exclusive of:  Separately billable procedures and treating other patients   Critical care was necessary to treat or prevent imminent or life-threatening deterioration of the following conditions:  Respiratory failure and CNS failure or compromise   Critical care was time spent personally by me on the following activities:  Development of treatment plan with patient or surrogate, evaluation of patient's response to treatment, examination of patient, obtaining history from patient or surrogate, ordering and performing treatments and interventions, ordering and review of laboratory studies, ordering and review of radiographic studies, pulse oximetry, re-evaluation of patient's condition and review of old charts   ____________________________________________   INITIAL IMPRESSION / MDM / ASSESSMENT AND PLAN / ED COURSE  As part  of my medical decision making, I reviewed the following data within the electronic MEDICAL RECORD NUMBER History obtained from family, Nursing notes reviewed and incorporated, Labs reviewed , EKG interpreted , Old chart reviewed, Radiograph reviewed , Discussed with admitting physician , and Notes from prior ED visits   Differential diagnosis includes, but is not limited to, COPD exacerbation with hypoxemia, possible hypercapnia, acute respiratory infection such as a viral illness or pneumonia, medication or drug side effect such as benzodiazepine, alcohol, or narcotics.  The patient is on the cardiac monitor to evaluate for  evidence of arrhythmia and/or significant heart rate changes.  Patient is in moderate respiratory distress and is obviously tiring.  However there may be other factors such as CO2 retention or medication use that is making him tired.  He does not require intubation at this time, nor BiPAP, although I would use BiPAP with some reluctance given his decreased level of responsiveness; however if he starts to decompensate respiratorily I will try BiPAP before intubation.  He is currently on 4 L of oxygen and satting around 90%.  Treating with 3 duo nebs.  Labs ordered.  I personally reviewed the patient's imaging and agree with the radiologist's interpretation that there are no acute abnormalities visible on chest x-ray.     Clinical Course as of 02/14/21 0741  Tue Feb 14, 2021  0501 Lactic acid normal, comprehensive metabolic panel is essentially normal, CBC is normal, lipase normal, VBG reassuring with PCO2 of 42 rather than being hypercapnic.   [CF]  0501 The issue is that the patient has chronic respiratory failure and is newly hypoxemic in the setting of influenza a and what appears to be an exacerbation of his chronic COPD.  He also has a body habitus suggestive of pickwickian syndrome.  I checked on him and on 4 L of oxygen via nasal cannula while asleep his oxygen level was 88% and  his respiratory rate is at least 30.  He remains somnolent in spite of not having hypercapnia.  However he is protecting his airway.  I do not think he needs BiPAP at this point but he will benefit from admission given his oxygen requirement and respiratory difficulties.  I have dated the patient and he agrees with the plan.  Consulting hospitalist for admission.  Deferring decision to treat with Tamiflu to the hospitalist team. [CF]  0501 Patient is influenza A positive. [CF]  0505 Sent secure chat to Dr. Para March with the hospitalist service with patient information for admission. [CF]    Clinical Course User Index [CF] Loleta Rose, MD     ____________________________________________  FINAL CLINICAL IMPRESSION(S) / ED DIAGNOSES  Final diagnoses:  Acute on chronic respiratory failure with hypoxemia (HCC)  Influenza A  Somnolence     MEDICATIONS GIVEN DURING THIS VISIT:  Medications  ipratropium-albuterol (DUONEB) 0.5-2.5 (3) MG/3ML nebulizer solution 3 mL (3 mLs Nebulization Given 02/14/21 0332)  ipratropium-albuterol (DUONEB) 0.5-2.5 (3) MG/3ML nebulizer solution 3 mL (3 mLs Nebulization Given 02/14/21 0331)  ipratropium-albuterol (DUONEB) 0.5-2.5 (3) MG/3ML nebulizer solution 3 mL (3 mLs Nebulization Given 02/14/21 0331)     ED Discharge Orders     None        Note:  This document was prepared using Dragon voice recognition software and may include unintentional dictation errors.   Loleta Rose, MD 02/14/21 [phone]

## 2021-02-14 NOTE — ED Notes (Signed)
Pt helped to the commode, bed pushed close to the commode he was very slow at getting there and working to breathe. Pt instructed not to get off commode without pulling call light for help back to bed.

## 2021-02-14 NOTE — ED Notes (Addendum)
Pt refused blood/iv stick by ems.  Pt refusing labs on arrival to er at this time.  Pt placed on monitor showing sinus tach.  Pt alert.  Dr York Cerise aware.

## 2021-02-14 NOTE — ED Notes (Signed)
Pt placed on 2 liters oxygen [place] 

## 2021-02-14 NOTE — H&P (Signed)
History and Physical    Jason Collier [id number] DOB: [birth year] DOA: 02/14/2021  PCP: Hortencia Pilar, MD   Patient coming from: Home  I have personally briefly reviewed patient's old medical records in [place]  Chief Complaint: Shortness of breath/chest pain  HPI: Jason Collier is a 30 y.o. male with medical history significant for morbid obesity (BMI 45), bipolar disorder, asthma, complex regional pain syndrome, nicotine dependence who presents to the ER via EMS for evaluation of worsening shortness of breath over the last 2 days associated with a cough productive of brown phlegm and fever.  He also complains of pleuritic chest pain. Patient states that his symptoms started 2 days ago and has progressively worsened since then.  He called EMS because he felt he could not breathe and when EMS arrived he had room air pulse oximetry in the 80s and was placed on 4 L of oxygen by nasal cannula.  Symptoms have been associated with anorexia but he denies having any nausea, no vomiting, no abdominal pain or any changes in his bowel habits.  He denies feeling dizzy or lightheaded and denies having any leg swelling.  He denies having orthopnea, no paroxysmal nocturnal dyspnea, no urinary symptoms, no dizziness, no lightheadedness, no blurred vision no focal deficit. VBG 7.41/42/53/26.6/87.4 Sodium 134, potassium 3.6, chloride 101, bicarb 25, glucose 109, BUN 11, creatinine 1.02, calcium 9.2, alkaline phosphatase 59, albumin 4.2, lipase 21, AST 26, ALT 32, total protein 7.6, lactic acid 1.2, white count 8.9, hemoglobin 15.2, hematocrit 45.7, MCV 81.2, RDW 15.1, platelet count 245 Influenza A PCR was positive Chest x-ray reviewed by me shows no evidence of active disease CT angiogram shows no evidence of pulmonary embolism.  Bilateral patchy groundglass opacities, findings can be seen in the setting of multifocal infection including viral etiologies.  Mildly enlarged hilar lymph nodes  likely reactive. Twelve-lead EKG reviewed by me shows sinus rhythm with right axis deviation.   ED Course: Patient is a 30 year old African-American male who is morbidly obese and has a history of asthma and nicotine dependence. He presents to the ER for evaluation of a 2-day history of worsening shortness of breath associated with a cough productive of brown phlegm, fever and pleuritic chest pain. Patient is tachycardic and tachypneic and imaging is suggestive of multifocal pneumonia. He is currently on 4 L of oxygen via nasal cannula and has increased work of breathing with associated tachypnea. He will be admitted to the hospital for further evaluation.   Review of Systems: As per HPI otherwise all other systems reviewed and negative.    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)    Environmental and seasonal allergies    can lead to brochitis   Kidney infection 2015   history of    Past Surgical History:  Procedure Laterality Date   KNEE ARTHROSCOPY WITH LATERAL MENISECTOMY Right 07/26/2015   Procedure: KNEE ARTHROSCOPY WITH LATERAL MENISECTOMY, DEBRIDEMENT OF FAT PAD;  Surgeon: Hessie Knows, MD;  Location: [facility] ORS;  Service: Orthopedics;  Laterality: Right;   NO PAST SURGERIES       reports that he has been smoking cigarettes. He has been smoking an average of .5 packs per day. He has quit using smokeless tobacco. He reports current alcohol use of about 2.0 standard drinks per week. He reports that he does not use drugs.  Allergies  Allergen Reactions   Tramadol  Itching   Hydrocodone Nausea And Vomiting, Nausea Only and Swelling   Dexamethasone Nausea Only   Escitalopram Other (See Comments)    Made him feel agitated    Hydrocodone Nausea And Vomiting   Hydroxyzine Hcl Other (See Comments)    Made him feel more anxious   Methocarbamol     Other reaction(s): Headache   Montelukast     Enhances  Schizophrenia   Sertraline Other (See Comments)    sleepy and agitated  Enhances Schizophrenia     Tramadol Itching   Fentanyl Diarrhea and Nausea And Vomiting   Quetiapine Other (See Comments)    Fatigue Per pt Increased drowsiness     Family History  Problem Relation Age of Onset   Anxiety disorder Mother       Prior to Admission medications   Medication Sig Start Date End Date Taking? Authorizing Provider  divalproex (DEPAKOTE) 500 MG DR tablet 2 po in am and 2 po in pm 06/12/18   [provider]  fluticasone-salmeterol (ADVAIR HFA) 115-21 MCG/ACT inhaler Inhale 2 puffs into the lungs 2 (two) times daily. 11/09/20   Sharen Hones, MD  Ipratropium-Albuterol (COMBIVENT RESPIMAT) 20-100 MCG/ACT AERS respimat Inhale 1 puff into the lungs every 6 (six) hours as needed for wheezing. 11/09/20   Sharen Hones, MD  naloxone [facility]) nasal spray 4 mg/0.1 mL Place 4 mg into the nose once. 06/07/20   [provider]  nicotine (NICODERM CQ - DOSED IN MG/24 HOURS) 14 mg/24hr patch Place 1 patch (14 mg total) onto the skin daily as needed (smoking cessation). 11/09/20   Sharen Hones, MD  oxyCODONE-acetaminophen (PERCOCET) 10-325 MG tablet Take 1 tablet by mouth every 4 (four) hours as needed for pain. 09/07/20   [provider]  clonazePAM (KLONOPIN) 1 MG tablet Take 1 mg by mouth at bedtime as needed for anxiety.  02/11/20  [provider]  CLONIDINE HCL PO Take 1 tablet by mouth 2 (two) times daily as needed (anxiety). Reported on 07/26/2015  08/23/19  [provider]  FLUoxetine (PROZAC) 20 MG tablet Take 20 mg by mouth daily.  08/23/19  [provider]  QUEtiapine (SEROQUEL) 50 MG tablet Take 2 tablets (100 mg total) by mouth at bedtime. Take half pill up to twice a day as needed for anxiety.  Take 2 pills regularly every night for sleep and mood. 01/28/17 08/23/19  Clapacs, Madie Reno, MD  sertraline (ZOLOFT) 50 MG tablet Take 50 mg by mouth daily. Reported  on 07/26/2015  08/23/19  [provider]    Physical Exam: Vitals:   02/14/21 0630 02/14/21 0700 02/14/21 0800 02/14/21 0919  BP: (!) 157/87 (!) 151/94 (!) 153/91   Pulse: (!) 108 100 95   Resp: (!) 25 (!) 22 (!) 36   Temp:    99.3 F (37.4 C)  TempSrc:    Oral  SpO2: 96% 95% 98%   Weight:      Height:         Vitals:   02/14/21 0630 02/14/21 0700 02/14/21 0800 02/14/21 0919  BP: (!) 157/87 (!) 151/94 (!) 153/91   Pulse: (!) 108 100 95   Resp: (!) 25 (!) 22 (!) 36   Temp:    99.3 F (37.4 C)  TempSrc:    Oral  SpO2: 96% 95% 98%   Weight:      Height:          Constitutional: Alert and oriented x 3.  Acutely ill-appearing.  Appears  to be in moderate respiratory distress with increased work of breathing.  Morbidly obese HEENT:      Head: Normocephalic and atraumatic.         Eyes: PERLA, EOMI, Conjunctivae are normal. Sclera is non-icteric.       Mouth/Throat: Mucous membranes are moist.       Neck: Supple with no signs of meningismus. Cardiovascular: Tachycardic. No murmurs, gallops, or rubs. 2+ symmetrical distal pulses are present . No JVD. No LE edema Respiratory: Tachypneic.coarse breath sounds of both lung fields. Faint  wheezes Gastrointestinal: Soft, non tender, and non distended with positive bowel sounds.  Genitourinary: No CVA tenderness. Musculoskeletal: Nontender with normal range of motion in all extremities. No cyanosis, or erythema of extremities. Neurologic:  Face is symmetric. Moving all extremities. No gross focal neurologic deficits . Skin: Skin is warm, dry.  No rash or ulcers Psychiatric: Mood and affect are normal    Labs on Admission: I have personally reviewed following labs and imaging studies  CBC: Recent Labs  Lab 02/14/21 0357  WBC 8.9  NEUTROABS 7.0  HGB 15.2  HCT 45.7  MCV 81.2  PLT 99991111   Basic Metabolic Panel: Recent Labs  Lab 02/14/21 0357  NA 134*  K 3.6  CL 101  CO2 25  GLUCOSE 109*  BUN 11  CREATININE  1.02  CALCIUM 9.2   GFR: Estimated Creatinine Clearance: 155.1 mL/min (by C-G formula based on SCr of 1.02 mg/dL). Liver Function Tests: Recent Labs  Lab 02/14/21 0357  AST 26  ALT 32  ALKPHOS 59  BILITOT 0.8  PROT 7.6  ALBUMIN 4.2   Recent Labs  Lab 02/14/21 0357  LIPASE 21   No results for input(s): AMMONIA in the last 168 hours. Coagulation Profile: No results for input(s): INR, PROTIME in the last 168 hours. Cardiac Enzymes: No results for input(s): CKTOTAL, CKMB, CKMBINDEX, TROPONINI in the last 168 hours. BNP (last 3 results) No results for input(s): PROBNP in the last 8760 hours. HbA1C: No results for input(s): HGBA1C in the last 72 hours. CBG: No results for input(s): GLUCAP in the last 168 hours. Lipid Profile: No results for input(s): CHOL, HDL, LDLCALC, TRIG, CHOLHDL, LDLDIRECT in the last 72 hours. Thyroid Function Tests: No results for input(s): TSH, T4TOTAL, FREET4, T3FREE, THYROIDAB in the last 72 hours. Anemia Panel: No results for input(s): VITAMINB12, FOLATE, FERRITIN, TIBC, IRON, RETICCTPCT in the last 72 hours. Urine analysis:    Component Value Date/Time   COLORURINE YELLOW (A) 12/14/2015 1042   APPEARANCEUR CLEAR (A) 12/14/2015 1042   APPEARANCEUR Clear 12/31/2013 1713   LABSPEC 1.024 12/14/2015 1042   LABSPEC 1.015 12/31/2013 1713   PHURINE 5.0 12/14/2015 1042   GLUCOSEU NEGATIVE 12/14/2015 1042   GLUCOSEU Negative 12/31/2013 1713   HGBUR NEGATIVE 12/14/2015 1042   BILIRUBINUR NEGATIVE 12/14/2015 1042   BILIRUBINUR Negative 12/31/2013 1713   KETONESUR NEGATIVE 12/14/2015 1042   PROTEINUR NEGATIVE 12/14/2015 1042   NITRITE NEGATIVE 12/14/2015 1042   LEUKOCYTESUR NEGATIVE 12/14/2015 1042   LEUKOCYTESUR Negative 12/31/2013 1713    Radiological Exams on Admission: CT Angio Chest Pulmonary Embolism (PE) W or WO Contrast  Result Date: 02/14/2021 CLINICAL DATA:  Shortness of breath EXAM: CT ANGIOGRAPHY CHEST WITH CONTRAST TECHNIQUE:  Multidetector CT imaging of the chest was performed using the standard protocol during bolus administration of intravenous contrast. Multiplanar CT image reconstructions and MIPs were obtained to evaluate the vascular anatomy. CONTRAST:  62mL OMNIPAQUE IOHEXOL 350 MG/ML SOLN COMPARISON:  None. FINDINGS:  Cardiovascular: No evidence of pulmonary embolus. Evaluation of the subsegmental pulmonary arteries is limited due to bolus timing. Normal heart size. No pericardial effusion. No coronary artery calcifications. No atherosclerotic disease of the thoracic aorta. Mediastinum/Nodes: Small hiatal hernia. Thyroid is unremarkable. Mildly enlarged hilar lymph nodes. Reference right hilar lymph node measuring 1.4 cm in short axis on series 4, image 38, likely reactive. Lungs/Pleura: Central airways are patent. Bilateral patchy ground-glass opacities, most pronounced in the right lower lobe, left upper lobe, and left lower lobe. No pleural effusion or pneumothorax. Upper Abdomen: No acute abnormality. Musculoskeletal: No chest wall abnormality. No acute or significant osseous findings. Review of the MIP images confirms the above findings. IMPRESSION: 1. No evidence of pulmonary embolus. Evaluation of the subsegmental pulmonary arteries is limited due to bolus timing. 2. Bilateral patchy ground-glass opacities, findings can be seen in the setting of multifocal infection, including viral etiologies. 3. Mildly enlarged hilar lymph nodes, likely reactive. Electronically Signed   By: Allegra Lai M.D.   On: 02/14/2021 09:05   DG Chest Port 1 View  Result Date: 02/14/2021 CLINICAL DATA:  Possible sepsis EXAM: PORTABLE CHEST 1 VIEW COMPARISON:  11/07/2020 FINDINGS: The heart size and mediastinal contours are within normal limits. Both lungs are clear. The visualized skeletal structures are unremarkable. IMPRESSION: No active disease. Electronically Signed   By: Alcide Clever M.D.   On: 02/14/2021 03:05      Assessment/Plan Principal Problem:   Acute hypoxemic respiratory failure (HCC) Active Problems:   Tobacco abuse   Asthma   Influenza A   Pneumonia and influenza   Morbid obesity with BMI of 45.0-49.9, adult (HCC)   Bipolar disorder (HCC)     Patient is a morbidly obese 30 year old male who presents to the ER for evaluation of a 2-day history of worsening shortness of breath associated with a cough productive of brownish phlegm, fever, pleuritic chest pain and has an oxygen requirement (currently on 4 L of oxygen via nasal cannula.)   Acute hypoxemic respiratory failure Most likely secondary to influenza with superimposed bacterial pneumonia Patient has increased work of breathing with use of accessory muscles, he is tachypneic and on room air her pulse oximetry in the 80s requiring oxygen supplementation at 4 L to maintain pulse oximetry greater than 94% Will admit patient to stepdown and monitor closely     Pneumonia with influenza/Asthma Patient has no EKG patient has a history of asthma Patient's influenza A PCR is positive He also has a cough productive of colored phlegm associated with fever and pleuritic chest pain. Imaging shows bilateral groundglass patchy opacities Obtain procalcitonin levels Will place patient empirically on Rocephin and Zithromax Start patient on systemic steroids Place patient on scheduled and as needed bronchodilator therapy    Nicotine dependence Smoking cessation has been discussed with patient in detail (about 5 minutes of time was spent) Place patient on nicotine transdermal patch     Morbid obesity(BMI 45) Complicates overall prognosis and care    Bipolar disorder Continue Depakote  DVT prophylaxis: Lovenox  Code Status: full code  Family Communication: Greater than 50% of time was spent discussing patient's condition and plan of care bedside.  All questions and concerns have been addressed.  He verbalizes understanding and  agrees with plan. Disposition Plan: Back to previous home environment Consults called: PCCM  Status:At the time of admission, it appears that the appropriate admission status for this patient is inpatient. This is judged to be reasonable and necessary to provide the  required intensity of service to ensure the patient's safety given the presenting symptoms, physical exam findings, and initial radiographic and laboratory data in the context of their comorbid conditions. Patient requires inpatient status due to high intensity of service, high risk for further deterioration and high frequency of surveillance required.     Collier Bullock MD [facility]     02/14/2021, 10:10 AM

## 2021-02-14 NOTE — ED Triage Notes (Signed)
Pt brought in via ems from home with a fever and cough for 2 days.  Hx asthma.  Tylenoll given by ems.  Pt diaphoretic.  Pt alert.

## 2021-02-14 NOTE — ED Notes (Signed)
Report off to [place] rn 

## 2021-02-14 NOTE — Consult Note (Signed)
NAME:  Jason Collier, MRN:  [id number], DOB:  Jul 28, 1990, LOS: 0 ADMISSION DATE:  02/14/2021, CONSULTATION DATE:  02/14/2021 REFERRING MD:  Dr. Francine Graven, CHIEF COMPLAINT:  Shortness of breath   Brief Pt Description / Synopsis:  30 y.o. Male admitted with Acute Hypoxic Respiratory Failure in the setting Asthma Exacerbation due to Influenza A infection and suspected superimposed bacterial pneumonia.  History of Present Illness:  Jason Collier is a 30 year old male with a past medical history significant for asthma, Morbid obesity (BMI 45), complex regional pain syndrome, anxiety, and Bipolar disorder who presented to [facility] ED on 02/14/2021 due to complaints of shortness of breath, productive cough, pleuritic chest pain, chills, and generalized malaise.  Patient reports that symptoms began approximately 2 days ago and have progressively worsened since then.  He also endorses associated anorexia, but denied nausea, vomiting, diarrhea, abdominal pain lower extremity edema, orthopnea, paroxysmal nocturnal dyspnea.  He contacted EMS earlier this morning because he felt he could not breathe.  Upon EMS arrival he was found to be hypoxic with O2 sats in the 80s, and was subsequently placed on 4 L of supplemental oxygen via nasal cannula with improvement.  He denies any sick contacts.  Of note the patient follows with Pulmonologist Dr. Lanney Gins for moderate persistent asthma (atopic phenotype).  He was last seen on 11/23/20. He is managed with Advair HFA.  Has not been placed on Singulair due to previous adverse reaction in the past.  ED COURSE: Initial Vital Signs: Temperature 98.5 F orally, respiratory rate 40, pulse 106, blood pressure 166/98, SPO2 90% on room air Significant Labs: Sodium 134, glucose 109, BUN 11, creatinine 1.02, lactic acid 1.2, WBC 8.9 Positive for influenza A by PCR Imaging: Chest X-ray: The heart size and mediastinal contours are within normal limits. Both lungs are clear. The  visualized skeletal structures are unremarkable CTA Chest: 1. No evidence of pulmonary embolus. Evaluation of the subsegmental pulmonary arteries is limited due to bolus timing. 2. Bilateral patchy ground-glass opacities, findings can be seen in the setting of multifocal infection, including viral etiologies. 3. Mildly enlarged hilar lymph nodes, likely reactive. Medications given: 3 duonebs  Hospitalist were contacted for admission.  While in ED he remains tachypneic (RR 25-40), with increased work of breathing and accessory muscle use.  He is able to talk in full sentences at this time.  Due to his work of breathing, PCCM was contacted for further assistance with management of acute hypoxic respiratory failure in the setting of asthma exacerbation due to influenza a infection and suspected superimposed bacterial pneumonia.  Pertinent  Medical History  Asthma Anxiety  Micro Data:  02/14/21: SARS-CoV-2 PCR>>negative 02/14/21: Influenza PCR>> Positive 02/14/21: Sputum>> 02/14/21: Strep pneumo urinary antigen>> 02/14/21: Legionella urinary antigen>> 02/14/21: Mycoplasma Pneumoniae>>  Antimicrobials:  Azithromycin 11/15>> Ceftriaxone 11/15>> Tamiflu 11/15>>  Significant Hospital Events: Including procedures, antibiotic start and stop dates in addition to other pertinent events   11/15: To be admitted by [facility] for acute hypoxic respiratory failure;  PCCM consulted due to work of breathing  Interim History / Subjective:  -Presented to the ED early this morning with shortness of breath, cough, sputum production, fever, chills, generalized malaise ~found to be positive for influenza A -TRH to admit to stepdown -With moderate respiratory distress and increased work of breathing -Able to speak in full sentences -PCCM consulted  Objective   Blood pressure (!) 153/91, pulse 95, temperature 99.3 F (37.4 C), temperature source Oral, resp. rate (!) 36, height 5\' 10"  (1.778  m), weight (!)  144.7 kg, SpO2 98 %.       No intake or output data in the 24 hours ending 02/14/21 1029 Filed Weights   02/14/21 0245  Weight: (!) 144.7 kg    Examination: General: Acutely ill-appearing male, laying in bed, on 4 L nasal cannula, with moderate respiratory distress HENT: Atraumatic, normocephalic, neck supple, no JVD Lungs: Clear breath sounds bilaterally with expiratory wheezing noted to bilateral bases, tachypnea, accessory muscle use, even Cardiovascular: Tachycardia, regular rhythm, S1-S2, no murmurs, rubs, gallops Abdomen: Obese, soft, nontender, nondistended, no guarding or rebound tenderness, bowel sounds positive x4 Extremities: Generalized weakness, normal bulk and tone, no deformities, no edema Neuro: Lethargic however arouses easily to voice, follows commands, no focal deficits, speech clear, pupils PERRLA GU: Deferred  Resolved Hospital Problem list     Assessment & Plan:   Acute Hypoxic Respiratory Failure in the setting of Asthma Exacerbation due to Influenza A infection & suspected superimposed bacterial pneumonia PMHx: Asthma, tobacco abuse, morbid obesity -CTA Chest 11/15: 1. No evidence of pulmonary embolus. Evaluation of the subsegmental pulmonary arteries is limited due to bolus timing. 2. Bilateral patchy ground-glass opacities, findings can be seen in the setting of multifocal infection, including viral etiologies. 3. Mildly enlarged hilar lymph nodes, likely reactive. -Supplemental O2 as needed to maintain O2 sats >92% -BiPAP as needed -Follow intermittent CXR & ABG as needed -Scheduled and PRN Bronchodilators, Dulera -Start IV Solumedrol 40 mg BID -Tamiflu and ABX as above -Ensure Pulmonary hygiene -Encourage smoking cessation  Sepsis due to Influenza A infection & suspected superimposed bacterial pneumonia  (Meets SIRS Criteria: RR >20, HR >90) -Monitor fever curve -Trend WBC's & Procalcitonin -Follow cultures as above -Tamiflu, plan for 5  days -Continue empiric Azithromycin & Ceftriaxone pending cultures & sensitivities -Lactic acid is normal ( 1.2 ~ 1.6)  Mild Hyponatremia, suspect due to poor PO intake last several days -Monitor I&O's / urinary output -Follow BMP -Ensure adequate renal perfusion -Avoid nephrotoxic agents as able -Replace electrolytes as indicated -IV fluids   Best Practice (right click and "Reselect all SmartList Selections" daily)   Diet/type: Regular consistency (see orders) DVT prophylaxis: LMWH GI prophylaxis: N/A Lines: N/A Foley:  N/A Code Status:  full code Last date of multidisciplinary goals of care discussion [02/14/2021]  Labs   CBC: Recent Labs  Lab 02/14/21 0357  WBC 8.9  NEUTROABS 7.0  HGB 15.2  HCT 45.7  MCV 81.2  PLT 245    Basic Metabolic Panel: Recent Labs  Lab 02/14/21 0357  NA 134*  K 3.6  CL 101  CO2 25  GLUCOSE 109*  BUN 11  CREATININE 1.02  CALCIUM 9.2   GFR: Estimated Creatinine Clearance: 155.1 mL/min (by C-G formula based on SCr of 1.02 mg/dL). Recent Labs  Lab 02/14/21 0357 02/14/21 0917  WBC 8.9  --   LATICACIDVEN 1.2 1.6    Liver Function Tests: Recent Labs  Lab 02/14/21 0357  AST 26  ALT 32  ALKPHOS 59  BILITOT 0.8  PROT 7.6  ALBUMIN 4.2   Recent Labs  Lab 02/14/21 0357  LIPASE 21   No results for input(s): AMMONIA in the last 168 hours.  ABG    Component Value Date/Time   PHART 7.306 (L) 03/18/2016 2131   PCO2ART 43.0 03/18/2016 2131   PO2ART 67.0 (L) 03/18/2016 2131   HCO3 26.6 02/14/2021 0317   TCO2 23 03/18/2016 2131   ACIDBASEDEF 5.0 (H) 03/18/2016 2131   O2SAT 87.4 02/14/2021 0317  Coagulation Profile: No results for input(s): INR, PROTIME in the last 168 hours.  Cardiac Enzymes: No results for input(s): CKTOTAL, CKMB, CKMBINDEX, TROPONINI in the last 168 hours.  HbA1C: Hemoglobin A1C  Date/Time Value Ref Range Status  01/01/2014 04:13 AM 6.1 4.2 - 6.3 % Final    Comment:    The American  Diabetes Association recommends that a primary goal of therapy should be <7% and that physicians should reevaluate the treatment regimen in patients with HbA1c values consistently >8%.     CBG: No results for input(s): GLUCAP in the last 168 hours.  Review of Systems:   Positives in BOLD: Gen: Denies fever, chills, weight change, fatigue, night sweats HEENT: Denies blurred vision, double vision, hearing loss, tinnitus, sinus congestion, rhinorrhea, sore throat, neck stiffness, dysphagia PULM: Denies shortness of breath, cough, sputum production, hemoptysis, wheezing CV: Denies pleuritic chest pain, edema, orthopnea, paroxysmal nocturnal dyspnea, palpitations GI: Denies abdominal pain, nausea, vomiting, diarrhea, hematochezia, melena, constipation, change in bowel habits GU: Denies dysuria, hematuria, polyuria, oliguria, urethral discharge Endocrine: Denies hot or cold intolerance, polyuria, polyphagia or appetite change Derm: Denies rash, dry skin, scaling or peeling skin change Heme: Denies easy bruising, bleeding, bleeding gums Neuro: Denies headache, numbness, weakness, slurred speech, loss of memory or consciousness   Past Medical History:  He,  has a past medical history of Anxiety, Asthma (02/14/2021), CRPS (complex regional pain syndrome type I), Environmental and seasonal allergies, and Kidney infection (2015).   Surgical History:   Past Surgical History:  Procedure Laterality Date   KNEE ARTHROSCOPY WITH LATERAL MENISECTOMY Right 07/26/2015   Procedure: KNEE ARTHROSCOPY WITH LATERAL MENISECTOMY, DEBRIDEMENT OF FAT PAD;  Surgeon: Hessie Knows, MD;  Location: [facility] ORS;  Service: Orthopedics;  Laterality: Right;   NO PAST SURGERIES       Social History:   reports that he has been smoking cigarettes. He has been smoking an average of .5 packs per day. He has quit using smokeless tobacco. He reports current alcohol use of about 2.0 standard drinks per week. He reports that he  does not use drugs.   Family History:  His family history includes Anxiety disorder in his mother.   Allergies Allergies  Allergen Reactions   Tramadol Itching   Hydrocodone Nausea And Vomiting, Nausea Only and Swelling   Dexamethasone Nausea Only   Escitalopram Other (See Comments)    Made him feel agitated    Hydrocodone Nausea And Vomiting   Hydroxyzine Hcl Other (See Comments)    Made him feel more anxious   Methocarbamol     Other reaction(s): Headache   Montelukast     Enhances Schizophrenia   Sertraline Other (See Comments)    sleepy and agitated  Enhances Schizophrenia     Tramadol Itching   Fentanyl Diarrhea and Nausea And Vomiting   Quetiapine Other (See Comments)    Fatigue Per pt Increased drowsiness      Home Medications  Prior to Admission medications   Medication Sig Start Date End Date Taking? Authorizing Provider  divalproex (DEPAKOTE) 500 MG DR tablet 2 po in am and 2 po in pm 06/12/18   [provider]  fluticasone-salmeterol (ADVAIR HFA) 115-21 MCG/ACT inhaler Inhale 2 puffs into the lungs 2 (two) times daily. 11/09/20   Sharen Hones, MD  Ipratropium-Albuterol (COMBIVENT RESPIMAT) 20-100 MCG/ACT AERS respimat Inhale 1 puff into the lungs every 6 (six) hours as needed for wheezing. 11/09/20   Sharen Hones, MD  naloxone [facility]) nasal spray 4 mg/0.1  mL Place 4 mg into the nose once. 06/07/20   [provider]  nicotine (NICODERM CQ - DOSED IN MG/24 HOURS) 14 mg/24hr patch Place 1 patch (14 mg total) onto the skin daily as needed (smoking cessation). 11/09/20   Sharen Hones, MD  oxyCODONE-acetaminophen (PERCOCET) 10-325 MG tablet Take 1 tablet by mouth every 4 (four) hours as needed for pain. 09/07/20   [provider]  clonazePAM (KLONOPIN) 1 MG tablet Take 1 mg by mouth at bedtime as needed for anxiety.  02/11/20  [provider]  CLONIDINE HCL PO Take 1 tablet by mouth 2 (two) times daily as needed (anxiety). Reported on  07/26/2015  08/23/19  [provider]  FLUoxetine (PROZAC) 20 MG tablet Take 20 mg by mouth daily.  08/23/19  [provider]  QUEtiapine (SEROQUEL) 50 MG tablet Take 2 tablets (100 mg total) by mouth at bedtime. Take half pill up to twice a day as needed for anxiety.  Take 2 pills regularly every night for sleep and mood. 01/28/17 08/23/19  Clapacs, Madie Reno, MD  sertraline (ZOLOFT) 50 MG tablet Take 50 mg by mouth daily. Reported on 07/26/2015  08/23/19  [provider]     Critical care time: 50 minutes     Darel Hong, AGACNP-BC  Pulmonary & [place] epic messenger for cross cover needs If after hours, please call E-link

## 2021-02-14 NOTE — Progress Notes (Signed)
PHARMACIST - PHYSICIAN COMMUNICATION  CONCERNING:  Enoxaparin (Lovenox) for DVT Prophylaxis    RECOMMENDATION: Patient was prescribed enoxaprin 40mg  q24 hours for VTE prophylaxis.   Filed Weights   02/14/21 0245  Weight: (!) 144.7 kg (319 lb)    Body mass index is 45.77 kg/m.  Estimated Creatinine Clearance: 155.1 mL/min (by C-G formula based on SCr of 1.02 mg/dL).   Based on [facility] policy patient is candidate for enoxaparin 0.5mg /kg TBW SQ every 24 hours based on BMI being >30.  DESCRIPTION: Pharmacy has adjusted enoxaparin dose per [facility] policy.  Patient is now receiving enoxaparin 72.5 mg every 24 hours    [facility], PharmD, BCPS Clinical Pharmacist 02/14/2021 9:52 AM

## 2021-02-14 NOTE — ED Notes (Signed)
X-ray in with pt 

## 2021-02-15 DIAGNOSIS — J4551 Severe persistent asthma with (acute) exacerbation: Secondary | ICD-10-CM | POA: Diagnosis not present

## 2021-02-15 DIAGNOSIS — J1100 Influenza due to unidentified influenza virus with unspecified type of pneumonia: Secondary | ICD-10-CM | POA: Diagnosis not present

## 2021-02-15 DIAGNOSIS — J9601 Acute respiratory failure with hypoxia: Secondary | ICD-10-CM | POA: Diagnosis not present

## 2021-02-15 LAB — URINALYSIS, COMPLETE (UACMP) WITH MICROSCOPIC
Bacteria, UA: NONE SEEN
Bilirubin Urine: NEGATIVE
Glucose, UA: NEGATIVE mg/dL
Hgb urine dipstick: NEGATIVE
Ketones, ur: 5 mg/dL — AB
Leukocytes,Ua: NEGATIVE
Nitrite: NEGATIVE
Protein, ur: 30 mg/dL — AB
Specific Gravity, Urine: 1.013 (ref 1.005–1.030)
Squamous Epithelial / HPF: NONE SEEN (ref 0–5)
pH: 7 (ref 5.0–8.0)

## 2021-02-15 LAB — PROCALCITONIN: Procalcitonin: <0.1 ng/mL

## 2021-02-15 LAB — BASIC METABOLIC PANEL
Anion gap: 8 (ref 5–15)
BUN: 10 mg/dL (ref 6–20)
CO2: 24 mmol/L (ref 22–32)
Calcium: 9.1 mg/dL (ref 8.9–10.3)
Chloride: 104 mmol/L (ref 98–111)
Creatinine, Ser: 1.01 mg/dL (ref 0.61–1.24)
GFR, Estimated: >60 mL/min (ref >60)
Glucose, Bld: 124 mg/dL — ABNORMAL HIGH (ref 70–99)
Potassium: 4 mmol/L (ref 3.5–5.1)
Sodium: 136 mmol/L (ref 135–145)

## 2021-02-15 LAB — CBC
HCT: 45.1 % (ref 39.0–52.0)
Hemoglobin: 15.1 g/dL (ref 13.0–17.0)
MCH: 26.4 pg (ref 26.0–34.0)
MCHC: 33.5 g/dL (ref 30.0–36.0)
MCV: 78.8 fL — ABNORMAL LOW (ref 80.0–100.0)
Platelets: 253 10*3/uL (ref 150–400)
RBC: 5.72 MIL/uL (ref 4.22–5.81)
RDW: 15.1 % (ref 11.5–15.5)
WBC: 15.6 10*3/uL — ABNORMAL HIGH (ref 4.0–10.5)
nRBC: 0 % (ref 0.0–0.2)

## 2021-02-15 LAB — STREP PNEUMONIAE URINARY ANTIGEN: Strep Pneumo Urinary Antigen: NEGATIVE

## 2021-02-15 LAB — MYCOPLASMA PNEUMONIAE ANTIBODY, IGM: Mycoplasma pneumo IgM: <770 U/mL (ref 0–769)

## 2021-02-15 LAB — MRSA NEXT GEN BY PCR, NASAL: MRSA by PCR Next Gen: NOT DETECTED

## 2021-02-15 MED ORDER — OXYCODONE-ACETAMINOPHEN 5-325 MG PO TABS
1.0000 | ORAL_TABLET | ORAL | Status: DC | PRN
  Administered 2021-02-15 – 2021-02-16 (×3): 1 via ORAL
  Filled 2021-02-15 (×3): qty 1

## 2021-02-15 MED ORDER — OXYCODONE HCL 5 MG PO TABS
5.0000 mg | ORAL_TABLET | Freq: Three times a day (TID) | ORAL | Status: DC | PRN
  Administered 2021-02-15: 5 mg via ORAL
  Filled 2021-02-15: qty 1

## 2021-02-15 MED ORDER — NALOXONE HCL 4 MG/0.1ML NA LIQD
4.0000 mg | Freq: Once | NASAL | Status: DC | PRN
  Filled 2021-02-15: qty 8

## 2021-02-15 MED ORDER — OXYCODONE-ACETAMINOPHEN 5-325 MG PO TABS
1.0000 | ORAL_TABLET | Freq: Three times a day (TID) | ORAL | Status: DC | PRN
  Administered 2021-02-15 (×2): 1 via ORAL
  Filled 2021-02-15 (×2): qty 1

## 2021-02-15 MED ORDER — AMLODIPINE BESYLATE 5 MG PO TABS
5.0000 mg | ORAL_TABLET | Freq: Every day | ORAL | Status: DC
  Administered 2021-02-15 – 2021-02-16 (×2): 5 mg via ORAL
  Filled 2021-02-15 (×2): qty 1

## 2021-02-15 MED ORDER — DIPHENHYDRAMINE HCL 25 MG PO CAPS
25.0000 mg | ORAL_CAPSULE | Freq: Every evening | ORAL | Status: DC | PRN
  Administered 2021-02-15: 25 mg via ORAL
  Filled 2021-02-15 (×2): qty 1

## 2021-02-15 MED ORDER — CHLORHEXIDINE GLUCONATE CLOTH 2 % EX PADS
6.0000 | MEDICATED_PAD | Freq: Every day | CUTANEOUS | Status: DC
  Administered 2021-02-15: 6 via TOPICAL

## 2021-02-15 MED ORDER — OXYCODONE HCL 5 MG PO TABS
5.0000 mg | ORAL_TABLET | ORAL | Status: DC | PRN
  Administered 2021-02-15 – 2021-02-16 (×3): 5 mg via ORAL
  Filled 2021-02-15 (×3): qty 1

## 2021-02-15 MED ORDER — BUTALBITAL-APAP-CAFFEINE 50-325-40 MG PO TABS: 1.0000 | ORAL_TABLET | Freq: Four times a day (QID) | ORAL | Status: DC | PRN

## 2021-02-15 MED ORDER — HYDRALAZINE HCL 50 MG PO TABS: 25.0000 mg | ORAL_TABLET | Freq: Four times a day (QID) | ORAL | Status: DC | PRN

## 2021-02-15 MED ORDER — OXYCODONE-ACETAMINOPHEN 10-325 MG PO TABS: 1.0000 | ORAL_TABLET | Freq: Three times a day (TID) | ORAL | Status: DC | PRN

## 2021-02-15 NOTE — Progress Notes (Signed)
Pt adm to stepdown status, received patient from ED on stretcher..  Pt placed on monitor and and 3L [place].  Mild SOB when transferred from bed to stretcher.  Alert upon arrival.. POC continued.

## 2021-02-15 NOTE — Progress Notes (Signed)
Pt transferred to 122.  Report given and POC continued/  Pt in no distress at time of transfer.Marland Kitchen

## 2021-02-15 NOTE — Progress Notes (Signed)
Patient transferred from ICU, bedside report given.  Patient alert and oriented times 4.  Patient ambulated independently to the bathroom with supervision only.  Patient provided with water and apple juice.  Patient oriented to room including bed controls and call bell.  All needs addressed at this time.

## 2021-02-15 NOTE — ED Notes (Signed)
Pt requesting pain medication for chronic L arm pain. 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 Pt is alert and oriented. VS obtained, water provided. Attending informed pt is requesting pain medication at this time.

## 2021-02-15 NOTE — Progress Notes (Addendum)
PROGRESS NOTE    Jason Collier   [id number]  DOB: 1990/04/03  PCP: Hortencia Pilar, MD    DOA: 02/14/2021 LOS: 1    Brief Narrative / Hospital Course to Date:   30 y.o. male with medical history significant for morbid obesity (BMI 45), bipolar disorder, asthma, complex regional pain syndrome, nicotine dependence who presents to the ER via EMS for evaluation of worsening shortness of breath over the last 2 days associated with a cough productive of brown phlegm, fever and pleuritic chest pain.  EMS found patient's O2 sat in 80's, placed on supplemental oxygen.  Evaluation in the ED - tachycardic and tachypneic, hypoxic and requiring 4 l/min O2.  Positive for Influenza A.  Chest x-ray without infiltrates to suggest pneumonia.  CTA chest negative for PE, showed bilateral ground-glass opacities which can be seen in viral infections, reactive hilar lymphadenopathy.  Assessment & Plan   Principal Problem:   Pneumonia and influenza Active Problems:   Acute hypoxemic respiratory failure (HCC)   Asthma   Influenza A   Tobacco abuse   Morbid obesity with BMI of 45.0-49.9, adult (HCC)   Bipolar disorder (HCC)   Acute hypoxemic respiratory failure due to Influenza A with viral pneumonia and secondary Acute Exacerbation of Severe Persistent Asthma - no leukocytosis on admission and procalcitonin negative, unlikely bacterial process.  New leukocytosis due to IV steroids.  Presented with increased work of breathing, use of accessory muscles, tachypnea consistent with asthma exacerbation for which he has known history, including 2 hospital admissions in past year.   Tested positive for Influenza A and presented with cough, fever, pleuritic chest pain. CTA chest with bilateral ground-glass opacities - likely due to viral infection --Monitor closely in stepdown --Stop antibiotics --Continue Tamiflu --IV steroids --Supplement O2 to keep spO2>90%, wean as tolearted --Scheduled and PRN  bronchodilators   Headache - most likely due to flu. --Fioricet if not relieved by home pain med --Monitor      Nicotine dependence - counseled on importance of cessation --nicotine patch ordered    Bipolar disorder - appears stable Continue Depakote  Complex regional pain syndrome - involving left upper extremity, due to crush injury --home Percocet resumed   Morbid obesity - Body mass index is 42.65 kg/m. Complicates overall care and prognosis.  Recommend lifestyle modifications including physical activity and diet for weight loss and overall long-term health.   DVT prophylaxis:    Diet:  Diet Orders (From admission, onward)     Start     Ordered   02/14/21 0943  Diet Heart Room service appropriate? Yes; Fluid consistency: Thin  Diet effective now       Question Answer Comment  Room service appropriate? Yes   Fluid consistency: Thin      02/14/21 0945              Code Status: Full Code   Subjective 02/15/21    Pt seen in stepdown this AM.  Reports feeling a little better this AM compared to admission yesterday.  Still has cough, episodes of feeling hot, less phlegm production.  Reports a significant headache, right-sided, behind the eye, pulsatile.  Requested home pain medication be resumed. No other acute complaints.   Disposition Plan & Communication   Status is: Inpatient  Remains inpatient appropriate because: on IV therapies, remains on oxygen without baseline requirement.   Consults, Procedures, Significant Events   Consultants:  PCCM  Procedures:  None  Antimicrobials:  Anti-infectives (From admission, onward)  Start     Dose/Rate Route Frequency Ordered Stop   02/14/21 1000  oseltamivir (TAMIFLU) capsule 75 mg        75 mg Oral 2 times daily 02/14/21 0929 02/19/21 0959   02/14/21 1000  cefTRIAXone (ROCEPHIN) 2 g in sodium chloride 0.9 % 100 mL IVPB  Status:  Discontinued        2 g 200 mL/hr over 30 Minutes Intravenous Every 24  hours 02/14/21 0945 02/15/21 0815   02/14/21 1000  azithromycin (ZITHROMAX) 500 mg in sodium chloride 0.9 % 250 mL IVPB  Status:  Discontinued        500 mg 250 mL/hr over 60 Minutes Intravenous Every 24 hours 02/14/21 0945 02/15/21 0815         Micro    Objective   Vitals:   02/15/21 1400 02/15/21 1441 02/15/21 1448 02/15/21 1500  BP: (!) 150/95   (!) 151/74  Pulse: 95   (!) 111  Resp: (!) 28   (!) 35  Temp: 98.4 F (36.9 C)     TempSrc: Oral     SpO2: 93% 95% 96% 95%  Weight:      Height:        Intake/Output Summary (Last 24 hours) at 02/15/2021 1636 Last data filed at 02/15/2021 1400 Gross per 24 hour  Intake 220 ml  Output 725 ml  Net -505 ml   Filed Weights   02/14/21 0245 02/15/21 1008  Weight: (!) 144.7 kg (!) 138.7 kg    Physical Exam:  General exam: awake, alert, no acute distress HEENT: atraumatic, clear conjunctiva, anicteric sclera, oist mucus membranes, hearing grossly normal  Respiratory system: Poor air movement, diffuse expiratory wheezes, normal respiratory effort at rest on 2 L/min [employer] O2. Cardiovascular system: normal S1/S2, RRR, no pedal edema.   Gastrointestinal system: soft, NT, ND, no HSM felt, +bowel sounds. Central nervous system: A&O x3. no gross focal neurologic deficits, normal speech Extremities: moves all, no edema, normal tone Skin: dry, intact, normal temperature, normal color, No rashes, lesions or ulcers Psychiatry: normal mood, congruent affect, judgement and insight appear normal  Labs   Data Reviewed: I have personally reviewed following labs and imaging studies  CBC: Recent Labs  Lab 02/14/21 0357 02/15/21 0756  WBC 8.9 15.6*  NEUTROABS 7.0  --   HGB 15.2 15.1  HCT 45.7 45.1  MCV 81.2 78.8*  PLT 245 [id number]   Basic Metabolic Panel: Recent Labs  Lab 02/14/21 0357 02/15/21 0756  NA 134* 136  K 3.6 4.0  CL 101 104  CO2 25 24  GLUCOSE 109* 124*  BUN 11 10  CREATININE 1.02 1.01  CALCIUM 9.2 9.1    GFR: Estimated Creatinine Clearance: 155.1 mL/min (by C-G formula based on SCr of 1.01 mg/dL). Liver Function Tests: Recent Labs  Lab 02/14/21 0357  AST 26  ALT 32  ALKPHOS 59  BILITOT 0.8  PROT 7.6  ALBUMIN 4.2   Recent Labs  Lab 02/14/21 0357  LIPASE 21   No results for input(s): AMMONIA in the last 168 hours. Coagulation Profile: No results for input(s): INR, PROTIME in the last 168 hours. Cardiac Enzymes: No results for input(s): CKTOTAL, CKMB, CKMBINDEX, TROPONINI in the last 168 hours. BNP (last 3 results) No results for input(s): PROBNP in the last 8760 hours. HbA1C: No results for input(s): HGBA1C in the last 72 hours. CBG: No results for input(s): GLUCAP in the last 168 hours. Lipid Profile: No results for input(s): CHOL, HDL, LDLCALC, TRIG,  CHOLHDL, LDLDIRECT in the last 72 hours. Thyroid Function Tests: No results for input(s): TSH, T4TOTAL, FREET4, T3FREE, THYROIDAB in the last 72 hours. Anemia Panel: No results for input(s): VITAMINB12, FOLATE, FERRITIN, TIBC, IRON, RETICCTPCT in the last 72 hours. Sepsis Labs: Recent Labs  Lab 02/14/21 0357 02/14/21 0917 02/14/21 1523 02/15/21 0756  PROCALCITON  --   --  <0.10 <0.10  LATICACIDVEN 1.2 1.6  --   --     Recent Results (from the past 240 hour(s))  Blood culture (routine single)     Status: None (Preliminary result)   Collection Time: 02/14/21  3:57 AM   Specimen: BLOOD  Result Value Ref Range Status   Specimen Description BLOOD RIGHT [facility]  Final   Special Requests   Final    BOTTLES DRAWN AEROBIC AND ANAEROBIC Blood Culture adequate volume   Culture   Final    NO GROWTH 1 DAY Performed at [facility], [address], [place], [place] [address]    Report Status PENDING  Incomplete  Resp Panel by RT-PCR (Flu A&B, Covid) Nasopharyngeal Swab     Status: Abnormal   Collection Time: 02/14/21  3:57 AM   Specimen: Nasopharyngeal Swab; Nasopharyngeal(NP) swabs in vial transport medium   Result Value Ref Range Status   SARS Coronavirus 2 by RT PCR NEGATIVE NEGATIVE Final    Comment: (NOTE) SARS-CoV-2 target nucleic acids are NOT DETECTED.  The SARS-CoV-2 RNA is generally detectable in upper respiratory specimens during the acute phase of infection. The lowest concentration of SARS-CoV-2 viral copies this assay can detect is 138 copies/mL. A negative result does not preclude SARS-Cov-2 infection and should not be used as the sole basis for treatment or other patient management decisions. A negative result may occur with  improper specimen collection/handling, submission of specimen other than nasopharyngeal swab, presence of viral mutation(s) within the areas targeted by this assay, and inadequate number of viral copies(<138 copies/mL). A negative result must be combined with clinical observations, patient history, and epidemiological information. The expected result is Negative.  Fact Sheet for Patients:  BloggerCourse.com  Fact Sheet for Healthcare Providers:  SeriousBroker.it  This test is no t yet approved or cleared by the [place] FDA and  has been authorized for detection and/or diagnosis of SARS-CoV-2 by FDA under an Emergency Use Authorization (EUA). This EUA will remain  in effect (meaning this test can be used) for the duration of the COVID-19 declaration under Section 564(b)(1) of the Act, 21 U.S.C.section 360bbb-3(b)(1), unless the authorization is terminated  or revoked sooner.       Influenza A by PCR POSITIVE (A) NEGATIVE Final   Influenza B by PCR NEGATIVE NEGATIVE Final    Comment: (NOTE) The Xpert Xpress SARS-CoV-2/FLU/RSV plus assay is intended as an aid in the diagnosis of influenza from Nasopharyngeal swab specimens and should not be used as a sole basis for treatment. Nasal washings and aspirates are unacceptable for Xpert Xpress SARS-CoV-2/FLU/RSV testing.  Fact Sheet for  Patients: BloggerCourse.com  Fact Sheet for Healthcare Providers: SeriousBroker.it  This test is not yet approved or cleared by the [place] FDA and has been authorized for detection and/or diagnosis of SARS-CoV-2 by FDA under an Emergency Use Authorization (EUA). This EUA will remain in effect (meaning this test can be used) for the duration of the COVID-19 declaration under Section 564(b)(1) of the Act, 21 U.S.C. section 360bbb-3(b)(1), unless the authorization is terminated or revoked.  Performed at [facility], [address]  [place], [place] [address]   Culture, blood (routine x 2) Call MD if unable to obtain prior to antibiotics being given     Status: None (Preliminary result)   Collection Time: 02/14/21  3:21 PM   Specimen: BLOOD  Result Value Ref Range Status   Specimen Description BLOOD LEFT ANTECUBITAL  Final   Special Requests   Final    BOTTLES DRAWN AEROBIC AND ANAEROBIC Blood Culture adequate volume   Culture   Final    NO GROWTH < 24 HOURS Performed at [facility], [address], [place], [place] [address]    Report Status PENDING  Incomplete  Culture, blood (routine x 2) Call MD if unable to obtain prior to antibiotics being given     Status: None (Preliminary result)   Collection Time: 02/14/21  3:23 PM   Specimen: BLOOD  Result Value Ref Range Status   Specimen Description BLOOD BLOOD LEFT HAND  Final   Special Requests   Final    BOTTLES DRAWN AEROBIC AND ANAEROBIC Blood Culture adequate volume   Culture   Final    NO GROWTH < 24 HOURS Performed at [facility], [address] [place], [place] [address]    Report Status PENDING  Incomplete  MRSA Next Gen by PCR, Nasal     Status: None   Collection Time: 02/15/21 10:11 AM   Specimen: Nasal Mucosa; Nasal Swab  Result Value Ref Range Status   MRSA by PCR Next Gen NOT DETECTED NOT DETECTED Final    Comment:  (NOTE) The GeneXpert MRSA Assay (FDA approved for NASAL specimens only), is one component of a comprehensive MRSA colonization surveillance program. It is not intended to diagnose MRSA infection nor to guide or monitor treatment for MRSA infections. Test performance is not FDA approved in patients less than 54 years old. Performed at [facility], [address] [place], [place] [address]       Imaging Studies   CT Angio Chest Pulmonary Embolism (PE) W or WO Contrast  Result Date: 02/14/2021 CLINICAL DATA:  Shortness of breath EXAM: CT ANGIOGRAPHY CHEST WITH CONTRAST TECHNIQUE: Multidetector CT imaging of the chest was performed using the standard protocol during bolus administration of intravenous contrast. Multiplanar CT image reconstructions and MIPs were obtained to evaluate the vascular anatomy. CONTRAST:  58mL OMNIPAQUE IOHEXOL 350 MG/ML SOLN COMPARISON:  None. FINDINGS: Cardiovascular: No evidence of pulmonary embolus. Evaluation of the subsegmental pulmonary arteries is limited due to bolus timing. Normal heart size. No pericardial effusion. No coronary artery calcifications. No atherosclerotic disease of the thoracic aorta. Mediastinum/Nodes: Small hiatal hernia. Thyroid is unremarkable. Mildly enlarged hilar lymph nodes. Reference right hilar lymph node measuring 1.4 cm in short axis on series 4, image 38, likely reactive. Lungs/Pleura: Central airways are patent. Bilateral patchy ground-glass opacities, most pronounced in the right lower lobe, left upper lobe, and left lower lobe. No pleural effusion or pneumothorax. Upper Abdomen: No acute abnormality. Musculoskeletal: No chest wall abnormality. No acute or significant osseous findings. Review of the MIP images confirms the above findings. IMPRESSION: 1. No evidence of pulmonary embolus. Evaluation of the subsegmental pulmonary arteries is limited due to bolus timing. 2. Bilateral patchy ground-glass opacities, findings can be  seen in the setting of multifocal infection, including viral etiologies. 3. Mildly enlarged hilar lymph nodes, likely reactive. Electronically Signed   By: Yetta Glassman M.D.   On: 02/14/2021 09:05   DG Chest Port 1 View  Result Date: 02/14/2021 CLINICAL DATA:  Possible sepsis EXAM: PORTABLE CHEST 1 VIEW COMPARISON:  11/07/2020 FINDINGS: The heart size and mediastinal contours are within normal limits. Both lungs are clear. The visualized skeletal structures are unremarkable. IMPRESSION: No active disease. Electronically Signed   By: Inez Catalina M.D.   On: 02/14/2021 03:05     Medications   Scheduled Meds:  amLODipine  5 mg Oral Daily   Chlorhexidine Gluconate Cloth  6 each Topical Daily   divalproex  1,500 mg Oral Q12H   enoxaparin (LOVENOX) injection  0.5 mg/kg Subcutaneous Q24H   ipratropium-albuterol  3 mL Nebulization Q6H   lidocaine  1 patch Transdermal Q24H   mometasone-formoterol  2 puff Inhalation BID   oseltamivir  75 mg Oral BID   [START ON 02/16/2021] predniSONE  40 mg Oral Q breakfast   sodium chloride flush  3 mL Intravenous Q12H   Continuous Infusions:  sodium chloride         LOS: 1 day    Time spent: 30 minutes   Ezekiel Slocumb, DO [facility]  02/15/2021, 4:36 PM      If 7PM-7AM, please contact night-coverage. How to contact the [facility] Attending or Consulting provider [place] or covering provider during after hours [place], for this patient?    Check the care team in [facility] and look for a) attending/consulting [facility] provider listed and b) the [facility] team listed Log into [email or web address] and use [place]'s universal password to access. If you do not have the password, please contact the hospital operator. Locate the [facility] provider you are looking for under [facility] and page to a number that you can be directly reached. If you still have difficulty reaching the provider, please page the [facility] (Director on Call) for the Hospitalists listed on  amion for assistance.

## 2021-02-15 NOTE — Progress Notes (Signed)
NAME:  Jason Collier, MRN:  [id number], DOB:  19-Feb-1991, LOS: 1 ADMISSION DATE:  02/14/2021, CONSULTATION DATE:  11/15 REFERRING MD:  [place], CHIEF COMPLAINT:  Dyspnea   History of Present Illness:  30 y/o male with acute hypoxemic respiratory failure in setting of asthma exacerbation from Flu A and possibly superimposed bacterial pneumonia.   Pertinent  Medical History  Asthma Anxiety  Significant Hospital Events: Including procedures, antibiotic start and stop dates in addition to other pertinent events   11/15 admission/PCCM consult  Micro 02/14/21: SARS-CoV-2 PCR>>negative 02/14/21: Influenza PCR>> Positive 02/14/21: Sputum>> 02/14/21: Strep pneumo urinary antigen>> 02/14/21: Legionella urinary antigen>> 02/14/21: Mycoplasma Pneumoniae>>  Antibiotics Azithromycine 11/15 >  Ceftriaxone 11/15 >  Tamiflu 11/15 >   Interim History / Subjective:  Feels better Coughing some Hasn't been out of bed Breathing improved Slept some last night  Objective   Blood pressure (!) 185/91, pulse 94, temperature 99.3 F (37.4 C), temperature source Oral, resp. rate (!) 28, height 5\' 10"  (1.778 m), weight (!) 144.7 kg, SpO2 93 %.        Intake/Output Summary (Last 24 hours) at 02/15/2021 0743 Last data filed at 02/14/2021 1843 Gross per 24 hour  Intake 100 ml  Output --  Net 100 ml   Filed Weights   02/14/21 0245  Weight: (!) 144.7 kg    Examination:  General:  Morbidly obese, resting comfortably in bed HENT: NCAT OP clear PULM: CTA B, normal effort (no wheezing today) CV: RRR, no mgr GI: BS+, soft, nontender MSK: normal bulk and tone Neuro: awake, alert, no distress, MAEW   Resolved Hospital Problem list     Assessment & Plan:   Acute hypoxemic respiratory failure in setting of asthma exacerbation due to Flu A > wheezing improved, hypoxemia improved Severe persistent asthma> two hospitalizations this year for asthma Cigarette smoker Counseled to quit  smoking Counseled adherance to outpatient Advair prescribed by Dr. 02/16/21 (rec 115/21 2 puff bid) Counseled on appropriate albuterol use If he can walk around today he could go home, OK to d/c from our perspective Will need to follow up with Dr. Karna Christmas  PCCM will sign off   Best Practice (right click and "Reselect all SmartList Selections" daily)   Per [facility]  Labs   CBC: Recent Labs  Lab 02/14/21 0357  WBC 8.9  NEUTROABS 7.0  HGB 15.2  HCT 45.7  MCV 81.2  PLT 245    Basic Metabolic Panel: Recent Labs  Lab 02/14/21 0357  NA 134*  K 3.6  CL 101  CO2 25  GLUCOSE 109*  BUN 11  CREATININE 1.02  CALCIUM 9.2   GFR: Estimated Creatinine Clearance: 155.1 mL/min (by C-G formula based on SCr of 1.02 mg/dL). Recent Labs  Lab 02/14/21 0357 02/14/21 0917 02/14/21 1523  PROCALCITON  --   --  <0.10  WBC 8.9  --   --   LATICACIDVEN 1.2 1.6  --     Liver Function Tests: Recent Labs  Lab 02/14/21 0357  AST 26  ALT 32  ALKPHOS 59  BILITOT 0.8  PROT 7.6  ALBUMIN 4.2   Recent Labs  Lab 02/14/21 0357  LIPASE 21   No results for input(s): AMMONIA in the last 168 hours.  ABG    Component Value Date/Time   PHART 7.306 (L) 03/18/2016 2131   PCO2ART 43.0 03/18/2016 2131   PO2ART 67.0 (L) 03/18/2016 2131   HCO3 26.6 02/14/2021 0317   TCO2 23 03/18/2016 2131   ACIDBASEDEF  5.0 (H) 03/18/2016 2131   O2SAT 87.4 02/14/2021 0317     Coagulation Profile: No results for input(s): INR, PROTIME in the last 168 hours.  Cardiac Enzymes: No results for input(s): CKTOTAL, CKMB, CKMBINDEX, TROPONINI in the last 168 hours.  HbA1C: Hemoglobin A1C  Date/Time Value Ref Range Status  01/01/2014 04:13 AM 6.1 4.2 - 6.3 % Final    Comment:    The American Diabetes Association recommends that a primary goal of therapy should be <7% and that physicians should reevaluate the treatment regimen in patients with HbA1c values consistently >8%.     CBG: No results for  input(s): GLUCAP in the last 168 hours.  Critical care time: n/a     Heber Blawenburg, MD  PCCM Pager: [phone] Cell: [phone] After 7:00 pm call Elink  [phone]

## 2021-02-16 LAB — CBC
HCT: 44.4 % (ref 39.0–52.0)
Hemoglobin: 14.7 g/dL (ref 13.0–17.0)
MCH: 26.7 pg (ref 26.0–34.0)
MCHC: 33.1 g/dL (ref 30.0–36.0)
MCV: 80.6 fL (ref 80.0–100.0)
Platelets: 251 10*3/uL (ref 150–400)
RBC: 5.51 MIL/uL (ref 4.22–5.81)
RDW: 15.1 % (ref 11.5–15.5)
WBC: 19.3 10*3/uL — ABNORMAL HIGH (ref 4.0–10.5)
nRBC: 0 % (ref 0.0–0.2)

## 2021-02-16 LAB — MAGNESIUM: Magnesium: 2.4 mg/dL (ref 1.7–2.4)

## 2021-02-16 LAB — LEGIONELLA PNEUMOPHILA SEROGP 1 UR AG: L. pneumophila Serogp 1 Ur Ag: NEGATIVE

## 2021-02-16 MED ORDER — OSELTAMIVIR PHOSPHATE 75 MG PO CAPS: 75.0000 mg | ORAL_CAPSULE | Freq: Two times a day (BID) | ORAL | 0 refills | Status: AC

## 2021-02-16 MED ORDER — NICOTINE 14 MG/24HR TD PT24
14.0000 mg | MEDICATED_PATCH | TRANSDERMAL | 1 refills | Status: AC
Start: 2021-02-16 — End: 2021-04-17

## 2021-02-16 MED ORDER — PREDNISONE 20 MG PO TABS: 40.0000 mg | ORAL_TABLET | Freq: Every day | ORAL | 0 refills | Status: AC

## 2021-02-16 MED ORDER — PREDNISONE 20 MG PO TABS: 40.0000 mg | ORAL_TABLET | Freq: Every day | ORAL | 0 refills | Status: DC

## 2021-02-16 MED ORDER — OSELTAMIVIR PHOSPHATE 75 MG PO CAPS: 75.0000 mg | ORAL_CAPSULE | Freq: Two times a day (BID) | ORAL | 0 refills | Status: DC

## 2021-02-16 MED ORDER — AMLODIPINE BESYLATE 5 MG PO TABS: 5.0000 mg | ORAL_TABLET | Freq: Every day | ORAL | 3 refills | Status: AC

## 2021-02-16 MED ORDER — AMLODIPINE BESYLATE 5 MG PO TABS: 5.0000 mg | ORAL_TABLET | Freq: Every day | ORAL | 3 refills | Status: DC

## 2021-02-16 NOTE — Discharge Summary (Signed)
Physician Discharge Summary  Jason Collier ZBM:[id number] DOB: 18-Sep-1990 DOA: 02/14/2021  PCP: Hortencia Pilar, MD  Admit date: 02/14/2021 Discharge date: 02/16/2021  Admitted From: home Disposition:  home  Recommendations for Outpatient Follow-up:  Follow up with PCP in 1-2 weeks Please obtain BMP/CBC in one week   Home Health: No  Equipment/Devices: None   Discharge Condition: Stable  CODE STATUS: Full  Diet recommendation: Regular  Discharge Diagnoses: Principal Problem:   Pneumonia and influenza Active Problems:   Acute hypoxemic respiratory failure ([place])   Asthma   Influenza A   Tobacco abuse   Morbid obesity with BMI of 45.0-49.9, adult ([place])   Bipolar disorder ([place])    Summary of HPI and Hospital Course:  30 y.o. male with medical history significant for morbid obesity (BMI 45), bipolar disorder, asthma, complex regional pain syndrome, nicotine dependence who presents to the ER via EMS for evaluation of worsening shortness of breath over the last 2 days associated with a cough productive of brown phlegm, fever and pleuritic chest pain.  EMS found patient's O2 sat in 80's, placed on supplemental oxygen.   Evaluation in the ED - tachycardic and tachypneic, hypoxic and requiring 4 l/min O2.  Positive for Influenza A.  Chest x-ray without infiltrates to suggest pneumonia.  CTA chest negative for PE, showed bilateral ground-glass opacities which can be seen in viral infections, reactive hilar lymphadenopathy    Acute hypoxemic respiratory failure due to Influenza A with viral pneumonia and secondary Acute Exacerbation of Severe Persistent Asthma - no leukocytosis on admission and procalcitonin negative, unlikely bacterial process.  New leukocytosis due to IV steroids.  Presented with increased work of breathing, use of accessory muscles, tachypnea consistent with asthma exacerbation for which he has known history, including 2 hospital admissions in past year.    Tested positive for Influenza A and presented with cough, fever, pleuritic chest pain. CTA chest with bilateral ground-glass opacities - likely due to viral infection --Patient was initially monitored closely in stepdown unit --Empiric antibiotics started on admission were stopped given no evidence of bacterial infection.  Patient continued to clinically improved off antibiotics --Treated with Tamiflu, prescribed remainder of course at d/c. --IV steroids>>Prednisone --Weaned to room air, initially required supplement O2 --Scheduled and PRN bronchodilators   Headache - most likely due to flu. Treated w Fioricet as HA was not relieved by home pain med  Nicotine dependence - counseled on importance of cessation --nicotine patch ordered during admission --discussed cessation and patient agreeable to Nicotine patches at discharge, prescribed --PCP follow up   Bipolar disorder - appears stable Continue Depakote   Complex regional pain syndrome - involving left upper extremity, due to crush injury --home Percocet resumed   Morbid obesity - Body mass index is 42.65 kg/m. Complicates overall care and prognosis.  Recommend lifestyle modifications including physical activity and diet for weight loss and overall long-term health.       Discharge Instructions   Discharge Instructions     Diet - low sodium heart healthy   Complete by: As directed    Discharge instructions   Complete by: As directed    For the Flu - please take Tamiflu for TWO more days.  For Asthma exacerbation (triggered by the Flu) - take Prednisone for THREE more days. --Be sure to use your Advair inhaler TWICE daily, 2 puffs --Your Combivent inhaler (ipratropium/albuterol) - use as needed up to 4 times for shortness of breath or wheezing   --Dr. Nicole Kindred, DO  [facility]   Increase activity slowly   Complete by: As directed       Allergies as of 02/16/2021       Reactions   Tramadol Itching    Hydrocodone Nausea And Vomiting, Nausea Only, Swelling   Dexamethasone Nausea Only   Escitalopram Other (See Comments)   Made him feel agitated   Hydrocodone Nausea And Vomiting   Hydroxyzine Hcl Other (See Comments)   Made him feel more anxious   Methocarbamol    Other reaction(s): Headache   Montelukast    Enhances Schizophrenia   Sertraline Other (See Comments)   sleepy and agitated  Enhances Schizophrenia    Tramadol Itching   Fentanyl Diarrhea, Nausea And Vomiting   Quetiapine Other (See Comments)   Fatigue Per pt Increased drowsiness        Medication List     TAKE these medications    Advair HFA 115-21 MCG/ACT inhaler Generic drug: fluticasone-salmeterol Inhale 2 puffs into the lungs 2 (two) times daily.   amLODipine 5 MG tablet Commonly known as: NORVASC Take 1 tablet (5 mg total) by mouth daily. Start taking on: February 17, 2021   Combivent Respimat 20-100 MCG/ACT Aers respimat Generic drug: Ipratropium-Albuterol Inhale 1 puff into the lungs every 6 (six) hours as needed for wheezing.   divalproex 500 MG DR tablet Commonly known as: DEPAKOTE 2 po in am and 2 po in pm   naloxone 4 MG/0.1ML Liqd nasal spray kit Commonly known as: NARCAN Place 4 mg into the nose once.   nicotine 14 mg/24hr patch Commonly known as: NICODERM CQ - dosed in mg/24 hours Place 1 patch (14 mg total) onto the skin daily as needed (smoking cessation).   oseltamivir 75 MG capsule Commonly known as: TAMIFLU Take 1 capsule (75 mg total) by mouth 2 (two) times daily for 2 days.   oxyCODONE-acetaminophen 10-325 MG tablet Commonly known as: PERCOCET Take 1 tablet by mouth every 4 (four) hours as needed for pain.   predniSONE 20 MG tablet Commonly known as: DELTASONE Take 2 tablets (40 mg total) by mouth daily with breakfast for 3 days. Start taking on: February 17, 2021        Allergies  Allergen Reactions   Tramadol Itching   Hydrocodone Nausea And Vomiting, Nausea  Only and Swelling   Dexamethasone Nausea Only   Escitalopram Other (See Comments)    Made him feel agitated    Hydrocodone Nausea And Vomiting   Hydroxyzine Hcl Other (See Comments)    Made him feel more anxious   Methocarbamol     Other reaction(s): Headache   Montelukast     Enhances Schizophrenia   Sertraline Other (See Comments)    sleepy and agitated  Enhances Schizophrenia     Tramadol Itching   Fentanyl Diarrhea and Nausea And Vomiting   Quetiapine Other (See Comments)    Fatigue Per pt Increased drowsiness      If you experience worsening of your admission symptoms, develop shortness of breath, life threatening emergency, suicidal or homicidal thoughts you must seek medical attention immediately by calling 911 or calling your MD immediately  if symptoms less severe.    Please note   You were cared for by a hospitalist during your hospital stay. If you have any questions about your discharge medications or the care you received while you were in the hospital after you are discharged, you can call the unit and asked to speak with the hospitalist on call if  the hospitalist that took care of you is not available. Once you are discharged, your primary care physician will handle any further medical issues. Please note that NO REFILLS for any discharge medications will be authorized once you are discharged, as it is imperative that you return to your primary care physician (or establish a relationship with a primary care physician if you do not have one) for your aftercare needs so that they can reassess your need for medications and monitor your lab values.   Consultations: None    Procedures/Studies: CT Angio Chest Pulmonary Embolism (PE) W or WO Contrast  Result Date: 02/14/2021 CLINICAL DATA:  Shortness of breath EXAM: CT ANGIOGRAPHY CHEST WITH CONTRAST TECHNIQUE: Multidetector CT imaging of the chest was performed using the standard protocol during bolus administration  of intravenous contrast. Multiplanar CT image reconstructions and MIPs were obtained to evaluate the vascular anatomy. CONTRAST:  97m OMNIPAQUE IOHEXOL 350 MG/ML SOLN COMPARISON:  None. FINDINGS: Cardiovascular: No evidence of pulmonary embolus. Evaluation of the subsegmental pulmonary arteries is limited due to bolus timing. Normal heart size. No pericardial effusion. No coronary artery calcifications. No atherosclerotic disease of the thoracic aorta. Mediastinum/Nodes: Small hiatal hernia. Thyroid is unremarkable. Mildly enlarged hilar lymph nodes. Reference right hilar lymph node measuring 1.4 cm in short axis on series 4, image 38, likely reactive. Lungs/Pleura: Central airways are patent. Bilateral patchy ground-glass opacities, most pronounced in the right lower lobe, left upper lobe, and left lower lobe. No pleural effusion or pneumothorax. Upper Abdomen: No acute abnormality. Musculoskeletal: No chest wall abnormality. No acute or significant osseous findings. Review of the MIP images confirms the above findings. IMPRESSION: 1. No evidence of pulmonary embolus. Evaluation of the subsegmental pulmonary arteries is limited due to bolus timing. 2. Bilateral patchy ground-glass opacities, findings can be seen in the setting of multifocal infection, including viral etiologies. 3. Mildly enlarged hilar lymph nodes, likely reactive. Electronically Signed   By: LYetta GlassmanM.D.   On: 02/14/2021 09:05   DG Chest Port 1 View  Result Date: 02/14/2021 CLINICAL DATA:  Possible sepsis EXAM: PORTABLE CHEST 1 VIEW COMPARISON:  11/07/2020 FINDINGS: The heart size and mediastinal contours are within normal limits. Both lungs are clear. The visualized skeletal structures are unremarkable. IMPRESSION: No active disease. Electronically Signed   By: MInez CatalinaM.D.   On: 02/14/2021 03:05       Subjective: Pt reports feeling better. SOB and wheezing improved.  No headache this AM.  No other acute complaints.   Wants to stop smoking and he agrees to nicotine patches and PCP follow up.   Discharge Exam: Vitals:   02/16/21 0511 02/16/21 0842  BP: (!) 142/96 (!) 147/88  Pulse: 83 94  Resp: 19 16  Temp: 98.3 F (36.8 C) 98.7 F (37.1 C)  SpO2: 94% 95%   Vitals:   02/15/21 2034 02/16/21 0110 02/16/21 0511 02/16/21 0842  BP: (!) 157/88 (!) 151/90 (!) 142/96 (!) 147/88  Pulse: 99 86 83 94  Resp: _0 Temp: 98.5 F (36.9 C) 98.1 F (36.7 C) 98.3 F (36.8 C) 98.7 F (37.1 C)  TempSrc:  Oral Oral Oral  SpO2: 95% 94% 94% 95%  Weight:      Height:        General: Pt is alert, awake, not in acute distress Cardiovascular: RRR, S1/S2 +, no rubs, no gallops Respiratory: CTA bilaterally with improved air movement, no wheezing, no rhonchi Abdominal: Soft, NT, ND, bowel sounds + Extremities:  no edema, no cyanosis    The results of significant diagnostics from this hospitalization (including imaging, microbiology, ancillary and laboratory) are listed below for reference.     Microbiology: Recent Results (from the past 240 hour(s))  Blood culture (routine single)     Status: None (Preliminary result)   Collection Time: 02/14/21  3:57 AM   Specimen: BLOOD  Result Value Ref Range Status   Specimen Description BLOOD RIGHT [facility]  Final   Special Requests   Final    BOTTLES DRAWN AEROBIC AND ANAEROBIC Blood Culture adequate volume   Culture   Final    NO GROWTH 2 DAYS Performed at [facility], [address], [place], [place] [address]    Report Status PENDING  Incomplete  Resp Panel by RT-PCR (Flu A&B, Covid) Nasopharyngeal Swab     Status: Abnormal   Collection Time: 02/14/21  3:57 AM   Specimen: Nasopharyngeal Swab; Nasopharyngeal(NP) swabs in vial transport medium  Result Value Ref Range Status   SARS Coronavirus 2 by RT PCR NEGATIVE NEGATIVE Final    Comment: (NOTE) SARS-CoV-2 target nucleic acids are NOT DETECTED.  The SARS-CoV-2 RNA is generally detectable in  upper respiratory specimens during the acute phase of infection. The lowest concentration of SARS-CoV-2 viral copies this assay can detect is 138 copies/mL. A negative result does not preclude SARS-Cov-2 infection and should not be used as the sole basis for treatment or other patient management decisions. A negative result may occur with  improper specimen collection/handling, submission of specimen other than nasopharyngeal swab, presence of viral mutation(s) within the areas targeted by this assay, and inadequate number of viral copies(<138 copies/mL). A negative result must be combined with clinical observations, patient history, and epidemiological information. The expected result is Negative.  Fact Sheet for Patients:  EntrepreneurPulse.com.au  Fact Sheet for Healthcare Providers:  IncredibleEmployment.be  This test is no t yet approved or cleared by the [place] FDA and  has been authorized for detection and/or diagnosis of SARS-CoV-2 by FDA under an Emergency Use Authorization (EUA). This EUA will remain  in effect (meaning this test can be used) for the duration of the COVID-19 declaration under Section 564(b)(1) of the Act, 21 U.S.C.section 360bbb-3(b)(1), unless the authorization is terminated  or revoked sooner.       Influenza A by PCR POSITIVE (A) NEGATIVE Final   Influenza B by PCR NEGATIVE NEGATIVE Final    Comment: (NOTE) The Xpert Xpress SARS-CoV-2/FLU/RSV plus assay is intended as an aid in the diagnosis of influenza from Nasopharyngeal swab specimens and should not be used as a sole basis for treatment. Nasal washings and aspirates are unacceptable for Xpert Xpress SARS-CoV-2/FLU/RSV testing.  Fact Sheet for Patients: EntrepreneurPulse.com.au  Fact Sheet for Healthcare Providers: IncredibleEmployment.be  This test is not yet approved or cleared by the [place] FDA and has  been authorized for detection and/or diagnosis of SARS-CoV-2 by FDA under an Emergency Use Authorization (EUA). This EUA will remain in effect (meaning this test can be used) for the duration of the COVID-19 declaration under Section 564(b)(1) of the Act, 21 U.S.C. section 360bbb-3(b)(1), unless the authorization is terminated or revoked.  Performed at [facility], [address] [place], [place] [address]   Culture, blood (routine x 2) Call MD if unable to obtain prior to antibiotics being given     Status: None (Preliminary result)   Collection Time: 02/14/21  3:21 PM   Specimen: BLOOD  Result Value Ref Range Status  Specimen Description BLOOD LEFT ANTECUBITAL  Final   Special Requests   Final    BOTTLES DRAWN AEROBIC AND ANAEROBIC Blood Culture adequate volume   Culture   Final    NO GROWTH 2 DAYS Performed at [facility], [address], [place], [place] [address]    Report Status PENDING  Incomplete  Culture, blood (routine x 2) Call MD if unable to obtain prior to antibiotics being given     Status: None (Preliminary result)   Collection Time: 02/14/21  3:23 PM   Specimen: BLOOD  Result Value Ref Range Status   Specimen Description BLOOD BLOOD LEFT HAND  Final   Special Requests   Final    BOTTLES DRAWN AEROBIC AND ANAEROBIC Blood Culture adequate volume   Culture   Final    NO GROWTH 2 DAYS Performed at [facility], [address], [place], [place] [address]    Report Status PENDING  Incomplete  MRSA Next Gen by PCR, Nasal     Status: None   Collection Time: 02/15/21 10:11 AM   Specimen: Nasal Mucosa; Nasal Swab  Result Value Ref Range Status   MRSA by PCR Next Gen NOT DETECTED NOT DETECTED Final    Comment: (NOTE) The GeneXpert MRSA Assay (FDA approved for NASAL specimens only), is one component of a comprehensive MRSA colonization surveillance program. It is not intended to diagnose MRSA infection nor to guide or monitor treatment  for MRSA infections. Test performance is not FDA approved in patients less than 45 years old. Performed at [facility], [address] [place], [place] [address]      Labs: BNP (last 3 results) Recent Labs    11/08/20 0404  BNP 7.9   Basic Metabolic Panel: Recent Labs  Lab 02/14/21 0357 02/15/21 0756 02/16/21 0400  NA 134* 136  --   K 3.6 4.0  --   CL 101 104  --   CO2 25 24  --   GLUCOSE 109* 124*  --   BUN 11 10  --   CREATININE 1.02 1.01  --   CALCIUM 9.2 9.1  --   MG  --   --  2.4   Liver Function Tests: Recent Labs  Lab 02/14/21 0357  AST 26  ALT 32  ALKPHOS 59  BILITOT 0.8  PROT 7.6  ALBUMIN 4.2   Recent Labs  Lab 02/14/21 0357  LIPASE 21   No results for input(s): AMMONIA in the last 168 hours. CBC: Recent Labs  Lab 02/14/21 0357 02/15/21 0756 02/16/21 0400  WBC 8.9 15.6* 19.3*  NEUTROABS 7.0  --   --   HGB 15.2 15.1 14.7  HCT 45.7 45.1 44.4  MCV 81.2 78.8* 80.6  PLT 245 253 251   Cardiac Enzymes: No results for input(s): CKTOTAL, CKMB, CKMBINDEX, TROPONINI in the last 168 hours. BNP: Invalid input(s): POCBNP CBG: No results for input(s): GLUCAP in the last 168 hours. D-Dimer No results for input(s): DDIMER in the last 72 hours. Hgb A1c No results for input(s): HGBA1C in the last 72 hours. Lipid Profile No results for input(s): CHOL, HDL, LDLCALC, TRIG, CHOLHDL, LDLDIRECT in the last 72 hours. Thyroid function studies No results for input(s): TSH, T4TOTAL, T3FREE, THYROIDAB in the last 72 hours.  Invalid input(s): FREET3 Anemia work up No results for input(s): VITAMINB12, FOLATE, FERRITIN, TIBC, IRON, RETICCTPCT in the last 72 hours. Urinalysis    Component Value Date/Time   COLORURINE YELLOW (A) 02/15/2021 0535  APPEARANCEUR CLEAR (A) 02/15/2021 0535   APPEARANCEUR Clear 12/31/2013 1713   LABSPEC 1.013 02/15/2021 0535   LABSPEC 1.015 12/31/2013 1713   PHURINE 7.0 02/15/2021 0535   GLUCOSEU NEGATIVE 02/15/2021  0535   GLUCOSEU Negative 12/31/2013 1713   HGBUR NEGATIVE 02/15/2021 0535   BILIRUBINUR NEGATIVE 02/15/2021 0535   BILIRUBINUR Negative 12/31/2013 1713   KETONESUR 5 (A) 02/15/2021 0535   PROTEINUR 30 (A) 02/15/2021 0535   NITRITE NEGATIVE 02/15/2021 0535   LEUKOCYTESUR NEGATIVE 02/15/2021 0535   LEUKOCYTESUR Negative 12/31/2013 1713   Sepsis Labs Invalid input(s): PROCALCITONIN,  WBC,  LACTICIDVEN Microbiology Recent Results (from the past 240 hour(s))  Blood culture (routine single)     Status: None (Preliminary result)   Collection Time: 02/14/21  3:57 AM   Specimen: BLOOD  Result Value Ref Range Status   Specimen Description BLOOD RIGHT [facility]  Final   Special Requests   Final    BOTTLES DRAWN AEROBIC AND ANAEROBIC Blood Culture adequate volume   Culture   Final    NO GROWTH 2 DAYS Performed at [facility], [address], [place], [place] [address]    Report Status PENDING  Incomplete  Resp Panel by RT-PCR (Flu A&B, Covid) Nasopharyngeal Swab     Status: Abnormal   Collection Time: 02/14/21  3:57 AM   Specimen: Nasopharyngeal Swab; Nasopharyngeal(NP) swabs in vial transport medium  Result Value Ref Range Status   SARS Coronavirus 2 by RT PCR NEGATIVE NEGATIVE Final    Comment: (NOTE) SARS-CoV-2 target nucleic acids are NOT DETECTED.  The SARS-CoV-2 RNA is generally detectable in upper respiratory specimens during the acute phase of infection. The lowest concentration of SARS-CoV-2 viral copies this assay can detect is 138 copies/mL. A negative result does not preclude SARS-Cov-2 infection and should not be used as the sole basis for treatment or other patient management decisions. A negative result may occur with  improper specimen collection/handling, submission of specimen other than nasopharyngeal swab, presence of viral mutation(s) within the areas targeted by this assay, and inadequate number of viral copies(<138 copies/mL). A negative result must be  combined with clinical observations, patient history, and epidemiological information. The expected result is Negative.  Fact Sheet for Patients:  EntrepreneurPulse.com.au  Fact Sheet for Healthcare Providers:  IncredibleEmployment.be  This test is no t yet approved or cleared by the [place] FDA and  has been authorized for detection and/or diagnosis of SARS-CoV-2 by FDA under an Emergency Use Authorization (EUA). This EUA will remain  in effect (meaning this test can be used) for the duration of the COVID-19 declaration under Section 564(b)(1) of the Act, 21 U.S.C.section 360bbb-3(b)(1), unless the authorization is terminated  or revoked sooner.       Influenza A by PCR POSITIVE (A) NEGATIVE Final   Influenza B by PCR NEGATIVE NEGATIVE Final    Comment: (NOTE) The Xpert Xpress SARS-CoV-2/FLU/RSV plus assay is intended as an aid in the diagnosis of influenza from Nasopharyngeal swab specimens and should not be used as a sole basis for treatment. Nasal washings and aspirates are unacceptable for Xpert Xpress SARS-CoV-2/FLU/RSV testing.  Fact Sheet for Patients: EntrepreneurPulse.com.au  Fact Sheet for Healthcare Providers: IncredibleEmployment.be  This test is not yet approved or cleared by the [place] FDA and has been authorized for detection and/or diagnosis of SARS-CoV-2 by FDA under an Emergency Use Authorization (EUA). This EUA will remain in effect (meaning this test can be used) for the duration of the COVID-19 declaration under Section  564(b)(1) of the Act, 21 U.S.C. section 360bbb-3(b)(1), unless the authorization is terminated or revoked.  Performed at [facility], [address] [place], [place] [address]   Culture, blood (routine x 2) Call MD if unable to obtain prior to antibiotics being given     Status: None (Preliminary result)   Collection Time: 02/14/21  3:21  PM   Specimen: BLOOD  Result Value Ref Range Status   Specimen Description BLOOD LEFT ANTECUBITAL  Final   Special Requests   Final    BOTTLES DRAWN AEROBIC AND ANAEROBIC Blood Culture adequate volume   Culture   Final    NO GROWTH 2 DAYS Performed at [facility], [address] [place], [place] [address]    Report Status PENDING  Incomplete  Culture, blood (routine x 2) Call MD if unable to obtain prior to antibiotics being given     Status: None (Preliminary result)   Collection Time: 02/14/21  3:23 PM   Specimen: BLOOD  Result Value Ref Range Status   Specimen Description BLOOD BLOOD LEFT HAND  Final   Special Requests   Final    BOTTLES DRAWN AEROBIC AND ANAEROBIC Blood Culture adequate volume   Culture   Final    NO GROWTH 2 DAYS Performed at [facility], [address], [place], [place] [address]    Report Status PENDING  Incomplete  MRSA Next Gen by PCR, Nasal     Status: None   Collection Time: 02/15/21 10:11 AM   Specimen: Nasal Mucosa; Nasal Swab  Result Value Ref Range Status   MRSA by PCR Next Gen NOT DETECTED NOT DETECTED Final    Comment: (NOTE) The GeneXpert MRSA Assay (FDA approved for NASAL specimens only), is one component of a comprehensive MRSA colonization surveillance program. It is not intended to diagnose MRSA infection nor to guide or monitor treatment for MRSA infections. Test performance is not FDA approved in patients less than 32 years old. Performed at [facility], [address] [place], [place] [address]      Time coordinating discharge: Over 30 minutes  SIGNED:   Ezekiel Slocumb, DO [facility] 02/16/2021, 9:39 AM   If 7PM-7AM, please contact night-coverage [email or web address]

## 2021-02-19 LAB — CULTURE, BLOOD (SINGLE)
Culture: NO GROWTH
Special Requests: ADEQUATE

## 2021-02-19 LAB — CULTURE, BLOOD (ROUTINE X 2)
Culture: NO GROWTH
Culture: NO GROWTH
Special Requests: ADEQUATE
Special Requests: ADEQUATE

## 2021-09-01 ENCOUNTER — Encounter: Payer: Self-pay | Admitting: Emergency Medicine

## 2021-09-01 ENCOUNTER — Other Ambulatory Visit: Payer: Self-pay

## 2021-09-01 ENCOUNTER — Ambulatory Visit
Admission: EM | Admit: 2021-09-01 | Discharge: 2021-09-01 | Disposition: A | Discharge status: Home / Self Care | Payer: Medicaid Other

## 2021-09-01 DIAGNOSIS — L0231 Cutaneous abscess of buttock: Secondary | ICD-10-CM

## 2021-09-01 MED ORDER — DOXYCYCLINE HYCLATE 100 MG PO CAPS: 100.0000 mg | ORAL_CAPSULE | Freq: Two times a day (BID) | ORAL | 0 refills | Status: AC

## 2021-09-01 NOTE — ED Provider Notes (Signed)
[facility]    CSN: [id number] Arrival date & time: 09/01/21  1149      History   Chief Complaint Chief Complaint  Patient presents with   Abscess    HPI Jason Collier is a 31 y.o. male.   HPI  31 year old male here for evaluation of skin complaint.  Patient is here for evaluation of an abscess on his right buttock that has been there for the past 10 days.  He states that he has had this multiple times in the past and he had to have it lanced 7 times, it is spontaneously ruptured twice, and this time his significant other lanced it with a 00 piercing needle.  He states that it has been draining a bloody pus.  He did have fever yesterday and the day before but he has not had a fever today.  He denies any pain with bowel movements or pus coming from his rectum.  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)    Environmental and seasonal allergies    can lead to brochitis   Kidney infection 2015   history of    Patient Active Problem List   Diagnosis Date Noted   COPD with acute exacerbation (HCC) 02/14/2021   Acute respiratory failure (HCC) 02/14/2021   Asthma 02/14/2021   Influenza A 02/14/2021   Pneumonia and influenza 02/14/2021   Morbid obesity with BMI of 45.0-49.9, adult (HCC) 02/14/2021   Bipolar disorder (HCC) 02/14/2021   SOB (shortness of breath) 11/08/2020   Tobacco abuse 11/08/2020   Depression 11/08/2020   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   Acute bronchitis with bronchospasm 11/07/2020   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   Panic attacks 01/28/2017   Benzodiazepine overdose 01/28/2017   Acute hypoxemic respiratory failure (HCC) 03/19/2016   Encounter for orogastric (OG) tube placement    Aspiration into airway    Neck mass     Past Surgical History:  Procedure Laterality Date   KNEE ARTHROSCOPY WITH LATERAL MENISECTOMY Right 07/26/2015    Procedure: KNEE ARTHROSCOPY WITH LATERAL MENISECTOMY, DEBRIDEMENT OF FAT PAD;  Surgeon: Kennedy BuckerMichael Menz, MD;  Location: [facility] ORS;  Service: Orthopedics;  Laterality: Right;   NO PAST SURGERIES         Home Medications    Prior to Admission medications   Medication Sig Start Date End Date Taking? Authorizing Provider  divalproex (DEPAKOTE) 500 MG DR tablet 1,500 mg 2 (two) times daily. 06/12/18  Yes [provider]  doxycycline (VIBRAMYCIN) 100 MG capsule Take 1 capsule (100 mg total) by mouth 2 (two) times daily. 09/01/21  Yes Becky Augustayan, Oryon Gary, NP  Ipratropium-Albuterol (COMBIVENT RESPIMAT) 20-100 MCG/ACT AERS respimat Inhale 1 puff into the lungs every 6 (six) hours as needed for wheezing. 11/09/20  Yes Marrion CoyZhang, Dekui, MD  oxyCODONE-acetaminophen (PERCOCET) 10-325 MG tablet Take 1 tablet by mouth every 4 (four) hours as needed for pain. 09/07/20  Yes [provider]  amLODipine (NORVASC) 5 MG tablet Take 1 tablet (5 mg total) by mouth daily. 02/17/21   Pennie BanterGriffith, Kelly A, DO  fluticasone-salmeterol (ADVAIR HFA) 2395321744115-21 MCG/ACT inhaler Inhale 2 puffs into the lungs 2 (two) times daily. 11/09/20   Marrion CoyZhang, Dekui, MD  naloxone [facility](NARCAN) nasal spray 4 mg/0.1 mL Place 4 mg into the nose once. 06/07/20   [provider]  nicotine (NICODERM CQ - DOSED IN MG/24 HOURS) 14 mg/24hr patch Place 1 patch (  14 mg total) onto the skin daily as needed (smoking cessation). 11/09/20   Marrion Coy, MD  pantoprazole (PROTONIX) 40 MG tablet Take 40 mg by mouth daily. 07/31/21   [provider]  clonazePAM (KLONOPIN) 1 MG tablet Take 1 mg by mouth at bedtime as needed for anxiety.  02/11/20  [provider]  CLONIDINE HCL PO Take 1 tablet by mouth 2 (two) times daily as needed (anxiety). Reported on 07/26/2015  08/23/19  [provider]  FLUoxetine (PROZAC) 20 MG tablet Take 20 mg by mouth daily.  08/23/19  [provider]  QUEtiapine (SEROQUEL) 50 MG tablet Take 2 tablets (100 mg  total) by mouth at bedtime. Take half pill up to twice a day as needed for anxiety.  Take 2 pills regularly every night for sleep and mood. 01/28/17 08/23/19  Clapacs, Jackquline Denmark, MD  sertraline (ZOLOFT) 50 MG tablet Take 50 mg by mouth daily. Reported on 07/26/2015  08/23/19  [provider]    Family History Family History  Problem Relation Age of Onset   Anxiety disorder Mother     Social History Social History   Tobacco Use   Smoking status: Every Day    Packs/day: 0.50    Types: Cigarettes   Smokeless tobacco: Former  Building services engineer Use: Never used  Substance Use Topics   Alcohol use: Yes    Alcohol/week: 2.0 standard drinks    Types: 2 Cans of beer per week    Comment: every three days   Drug use: No    Types: Marijuana    Comment: over 1 year ago     Allergies   Tramadol, Hydrocodone, Dexamethasone, Escitalopram, Hydrocodone, Hydroxyzine hcl, Methocarbamol, Montelukast, Sertraline, Tramadol, Fentanyl, and Quetiapine   Review of Systems Review of Systems  Constitutional:  Positive for fever.  Skin:  Positive for color change and wound.  Hematological: Negative.   Psychiatric/Behavioral: Negative.      Physical Exam Triage Vital Signs ED Triage Vitals  Enc Vitals Group     BP 09/01/21 1254 (!) 138/97     Pulse Rate 09/01/21 1254 (!) 101     Resp 09/01/21 1254 15     Temp 09/01/21 1254 98.2 F (36.8 C)     Temp Source 09/01/21 1254 Oral     SpO2 09/01/21 1254 96 %     Weight 09/01/21 1252 300 lb (136.1 kg)     Height 09/01/21 1252 5\' 11"  (1.803 m)     Head Circumference --      Peak Flow --      Pain Score 09/01/21 1251 6     Pain Loc --      Pain Edu? --      Excl. in GC? --    No data found.  Updated Vital Signs BP (!) 138/97 (BP Location: Left Arm)   Pulse (!) 101   Temp 98.2 F (36.8 C) (Oral)   Resp 15   Ht 5\' 11"  (1.803 m)   Wt 300 lb (136.1 kg)   SpO2 96%   BMI 41.84 kg/m   Visual Acuity Right Eye Distance:   Left Eye  Distance:   Bilateral Distance:    Right Eye Near:   Left Eye Near:    Bilateral Near:     Physical Exam Vitals and nursing note reviewed.  Constitutional:      Appearance: Normal appearance. He is not ill-appearing.  HENT:     Head: Normocephalic and atraumatic.  Skin:    General: Skin is warm and dry.     Capillary Refill: Capillary refill takes less than 2 seconds.     Findings: Erythema and lesion present.  Neurological:     General: No focal deficit present.     Mental Status: He is alert and oriented to person, place, and time.  Psychiatric:        Mood and Affect: Mood normal.        Behavior: Behavior normal.        Thought Content: Thought content normal.        Judgment: Judgment normal.     UC Treatments / Results  Labs (all labs ordered are listed, but only abnormal results are displayed) Labs Reviewed - No data to display  EKG   Radiology No results found.  Procedures Procedures (including critical care time)  Medications Ordered in UC Medications - No data to display  Initial Impression / Assessment and Plan / UC Course  I have reviewed the triage vital signs and the nursing notes.  Pertinent labs & imaging results that were available during my care of the patient were reviewed by me and considered in my medical decision making (see chart for details).  Patient is a very pleasant, nontoxic-appearing 31 year old male here for evaluation of a right buttock abscess that has been present for last 10 days.  As stated in the HPI above, patient has had this recur multiple times.  He has had to have it lanced 7 times in the past and has ruptured spontaneously on his own.  This time his significant other lanced it at home with a 00 piercing needle.  On exam patient does have a silver dollar area of induration surrounding a rather large opening.  There is some serosanguineous drainage on the gauze but no frank pus.  I am not able to express any with palpation of  the circumference of the wound.  I have advised the patient that he has a significant amount of infection remaining in the tissues and that he needs to either apply warm compresses or use sitz bath's to help facilitate the infection to drain.  I will start him on doxycycline twice daily for 10 days as well.  Return precautions reviewed.   Final Clinical Impressions(s) / UC Diagnoses   Final diagnoses:  Abscess of buttock, right     Discharge Instructions      Take the Doxycycline twice daily with food for 10 days.  Doxycycline will make you more sensitive to sunburn so wear sunscreen when outdoors and reapply it every 90 minutes.  Apply warm compresses to help promote drainage.  Use OTC Tylenol and Ibuprofen according to the package instructions as needed for pain.  Return for new or worsening symptoms.       ED Prescriptions     Medication Sig Dispense Auth. Provider   doxycycline (VIBRAMYCIN) 100 MG capsule Take 1 capsule (100 mg total) by mouth 2 (two) times daily. 20 capsule Becky Augusta, NP      PDMP not reviewed this encounter.   Becky Augusta, NP 09/01/21 1347

## 2021-09-01 NOTE — ED Triage Notes (Signed)
Patient c/o abscess between his buttock for the past 10 days.  Patient states that it has started draining.  Patient denies fevers.

## 2021-09-01 NOTE — Discharge Instructions (Signed)
Take the Doxycycline twice daily with food for 10 days.  Doxycycline will make you more sensitive to sunburn so wear sunscreen when outdoors and reapply it every 90 minutes.  Apply warm compresses to help promote drainage.  Use OTC Tylenol and Ibuprofen according to the package instructions as needed for pain.  Return for new or worsening symptoms.   

## 2021-11-21 ENCOUNTER — Ambulatory Visit
Admission: EM | Admit: 2021-11-21 | Discharge: 2021-11-21 | Disposition: A | Discharge status: Home / Self Care | Payer: Medicaid Other | Attending: Emergency Medicine | Admitting: Emergency Medicine

## 2021-11-21 ENCOUNTER — Ambulatory Visit (INDEPENDENT_AMBULATORY_CARE_PROVIDER_SITE_OTHER): Payer: Medicaid Other

## 2021-11-21 DIAGNOSIS — M25572 Pain in left ankle and joints of left foot: Secondary | ICD-10-CM | POA: Diagnosis not present

## 2021-11-21 NOTE — ED Provider Notes (Signed)
[facility]    CSN: [id number] Arrival date & time: 11/21/21  1601      History   Chief Complaint Chief Complaint  Patient presents with   Foot Injury    Left     HPI Jason Collier is a 31 y.o. male.   HPI  31 year old male here for evaluation of left heel pain.  Patient reports that 2 months ago he was playing basketball and he heard and felt a pop in the back of his ankle.  He states that he was able to walk on it and he has been continuing to work.  The pain eventually improved but then yesterday he was playing with his daughter and he lunged while pushing off with his left foot and he had a sensation that something separated in his left heel.  He states the pain is in the posterior aspect of the heel and comes up the outside.  This is associated with some numbness and tingling in his left big toe.  He has range of motion and he is able to bear weight.  There is swelling to the outside of his left ankle.  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)    Environmental and seasonal allergies    can lead to brochitis   Kidney infection 2015   history of    Patient Active Problem List   Diagnosis Date Noted   COPD with acute exacerbation (HCC) 02/14/2021   Acute respiratory failure (HCC) 02/14/2021   Asthma 02/14/2021   Influenza A 02/14/2021   Pneumonia and influenza 02/14/2021   Morbid obesity with BMI of 45.0-49.9, adult (HCC) 02/14/2021   Bipolar disorder (HCC) 02/14/2021   SOB (shortness of breath) 11/08/2020   Tobacco abuse 11/08/2020   Depression 11/08/2020   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   Acute bronchitis with bronchospasm 11/07/2020   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   Panic attacks 01/28/2017   Benzodiazepine overdose 01/28/2017   Acute hypoxemic respiratory failure (HCC) 03/19/2016   Encounter for orogastric (OG) tube placement     Aspiration into airway    Neck mass     Past Surgical History:  Procedure Laterality Date   KNEE ARTHROSCOPY WITH LATERAL MENISECTOMY Right 07/26/2015   Procedure: KNEE ARTHROSCOPY WITH LATERAL MENISECTOMY, DEBRIDEMENT OF FAT PAD;  Surgeon: Kennedy Bucker, MD;  Location: [facility] ORS;  Service: Orthopedics;  Laterality: Right;   NO PAST SURGERIES         Home Medications    Prior to Admission medications   Medication Sig Start Date End Date Taking? Authorizing Provider  divalproex (DEPAKOTE) 500 MG DR tablet 1,500 mg 2 (two) times daily. 06/12/18  Yes [provider]  fluticasone-salmeterol (ADVAIR HFA) 115-21 MCG/ACT inhaler Inhale 2 puffs into the lungs 2 (two) times daily. 11/09/20  Yes Marrion Coy, MD  Ipratropium-Albuterol (COMBIVENT RESPIMAT) 20-100 MCG/ACT AERS respimat Inhale 1 puff into the lungs every 6 (six) hours as needed for wheezing. 11/09/20  Yes Marrion Coy, MD  oxyCODONE-acetaminophen (PERCOCET) 10-325 MG tablet Take 1 tablet by mouth every 4 (four) hours as needed for pain. 09/07/20  Yes [provider]  pantoprazole (PROTONIX) 40 MG tablet Take 40 mg by mouth daily. 07/31/21  Yes [provider]  amLODipine (NORVASC) 5 MG tablet Take 1 tablet (5 mg total) by mouth daily. 02/17/21   Pennie Banter, DO  doxycycline (VIBRAMYCIN) 100 MG capsule  Take 1 capsule (100 mg total) by mouth 2 (two) times daily. 09/01/21   Becky Augusta, NP  naloxone [facility]) nasal spray 4 mg/0.1 mL Place 4 mg into the nose once. 06/07/20   [provider]  nicotine (NICODERM CQ - DOSED IN MG/24 HOURS) 14 mg/24hr patch Place 1 patch (14 mg total) onto the skin daily as needed (smoking cessation). 11/09/20   Marrion Coy, MD  clonazePAM (KLONOPIN) 1 MG tablet Take 1 mg by mouth at bedtime as needed for anxiety.  02/11/20  [provider]  CLONIDINE HCL PO Take 1 tablet by mouth 2 (two) times daily as needed (anxiety). Reported on 07/26/2015  08/23/19  [provider]  FLUoxetine (PROZAC) 20 MG tablet Take 20 mg by mouth daily.  08/23/19  [provider]  QUEtiapine (SEROQUEL) 50 MG tablet Take 2 tablets (100 mg total) by mouth at bedtime. Take half pill up to twice a day as needed for anxiety.  Take 2 pills regularly every night for sleep and mood. 01/28/17 08/23/19  Clapacs, Jackquline Denmark, MD  sertraline (ZOLOFT) 50 MG tablet Take 50 mg by mouth daily. Reported on 07/26/2015  08/23/19  [provider]    Family History Family History  Problem Relation Age of Onset   Anxiety disorder Mother     Social History Social History   Tobacco Use   Smoking status: Every Day    Packs/day: 0.50    Types: Cigarettes   Smokeless tobacco: Former  Building services engineer Use: Never used  Substance Use Topics   Alcohol use: Yes    Alcohol/week: 2.0 standard drinks of alcohol    Types: 2 Cans of beer per week    Comment: every three days   Drug use: No    Types: Marijuana    Comment: over 1 year ago     Allergies   Tramadol, Hydrocodone, Dexamethasone, Escitalopram, Hydrocodone, Hydroxyzine hcl, Methocarbamol, Montelukast, Sertraline, Tramadol, Fentanyl, and Quetiapine   Review of Systems Review of Systems  Musculoskeletal:  Positive for arthralgias and joint swelling.  Skin:  Negative for color change.  Neurological:  Positive for numbness. Negative for weakness.  Hematological: Negative.   Psychiatric/Behavioral: Negative.       Physical Exam Triage Vital Signs ED Triage Vitals  Enc Vitals Group     BP 11/21/21 1613 (S) (!) 160/103     Pulse Rate 11/21/21 1613 71     Resp --      Temp 11/21/21 1613 97.9 F (36.6 C)     Temp Source 11/21/21 1613 Oral     SpO2 11/21/21 1613 99 %     Weight 11/21/21 1611 290 lb (131.5 kg)     Height 11/21/21 1611 5\' 10"  (1.778 m)     Head Circumference --      Peak Flow --      Pain Score 11/21/21 1611 8     Pain Loc --      Pain Edu? --      Excl. in GC? --    No data  found.  Updated Vital Signs BP (S) (!) 160/103 (BP Location: Right Arm)   Pulse 71   Temp 97.9 F (36.6 C) (Oral)   Ht 5\' 10"  (1.778 m)   Wt 290 lb (131.5 kg)   SpO2 99%   BMI 41.61 kg/m   Visual Acuity Right Eye Distance:   Left Eye Distance:   Bilateral Distance:    Right Eye Near:  Left Eye Near:    Bilateral Near:     Physical Exam Vitals and nursing note reviewed.  Constitutional:      Appearance: Normal appearance. He is not ill-appearing.  Musculoskeletal:        General: Swelling, tenderness and signs of injury present. No deformity. Normal range of motion.  Skin:    General: Skin is warm and dry.     Capillary Refill: Capillary refill takes less than 2 seconds.     Findings: No bruising or erythema.  Neurological:     General: No focal deficit present.     Mental Status: He is alert and oriented to person, place, and time.  Psychiatric:        Mood and Affect: Mood normal.        Behavior: Behavior normal.        Thought Content: Thought content normal.        Judgment: Judgment normal.      UC Treatments / Results  Labs (all labs ordered are listed, but only abnormal results are displayed) Labs Reviewed - No data to display  EKG   Radiology DG Ankle Complete Left  Result Date: 11/21/2021 CLINICAL DATA:  Pain in lateral heel. EXAM: LEFT ANKLE COMPLETE - 3+ VIEW COMPARISON:  None Available. FINDINGS: There is no evidence of fracture, dislocation, or joint effusion. There is no evidence of arthropathy or other focal bone abnormality. Soft tissues are unremarkable. IMPRESSION: Negative. Electronically Signed   By: Larose Hires D.O.   On: 11/21/2021 16:55    Procedures Procedures (including critical care time)  Medications Ordered in UC Medications - No data to display  Initial Impression / Assessment and Plan / UC Course  I have reviewed the triage vital signs and the nursing notes.  Pertinent labs & imaging results that were available during  my care of the patient were reviewed by me and considered in my medical decision making (see chart for details).   Patient is a very pleasant, nontoxic-appearing 31 year old male here for evaluation of pain in his left heel that has been an ongoing issue for the past 2 months and became exacerbated yesterday when he was playing with his daughter.  He states that he planted with his left foot and pushed off with his heel and he felt something "separate" in the back of his heel.  He states the pain is at the posterior aspect of the heel and comes up the lateral aspect.  On exam patient's foot and ankle are in normal anatomical position.  His DP and PT pulses are 2+.  There is no ecchymosis or erythema noted.  He does complain of pain with palpation of the insertion of the left Achilles as well as with palpation of the Achilles tendon.  There is swelling to the lateral aspect of the Achilles tendon that extends from the calcaneus, behind the lateral malleolus, and up the distal leg.  Patient does have some pain with compression of the medial and lateral malleolus.  No pain with palpation of the midfoot, tarsals, or phalanges.  I am concerned that patient has an injury to his Achilles tendon.  I will obtain radiograph to rule out bony abnormality.  Left ankle x-rays independent reviewed and evaluated by me.  Impression: No evidence of fracture or dislocation.  Patient does have flat feet.  Soft tissues are unremarkable.  Radiology overread is pending. Radiology read states no evidence of fracture, dislocation, or joint effusion.  No evidence of arthropathy  or other focal bone abnormalities.  Soft tissue is unremarkable.  Negative exam.  I will place patient in cam walking boot and refer him to podiatry because I am concerned he has an Achilles tendon injury.  Final Clinical Impressions(s) / UC Diagnoses   Final diagnoses:  Acute left ankle pain     Discharge Instructions      Wear the cam walking boot  to protect your ankle and prevent further injury.  I have referred you to podiatry for further evaluation as I am concerned you may have injured your Achilles tendon.  Take over-the-counter ibuprofen according to package instructions as needed for pain.  Keep your left foot ankle elevated is much as possible.  Avoid any running or heavy lifting until after you have been evaluated by podiatry.     ED Prescriptions   None    PDMP not reviewed this encounter.   Becky Augusta, NP 11/21/21 [id number]

## 2021-11-21 NOTE — Discharge Instructions (Addendum)
Wear the cam walking boot to protect your ankle and prevent further injury.  I have referred you to podiatry for further evaluation as I am concerned you may have injured your Achilles tendon.  Take over-the-counter ibuprofen according to package instructions as needed for pain.  Keep your left foot ankle elevated is much as possible.  Avoid any running or heavy lifting until after you have been evaluated by podiatry.

## 2021-11-21 NOTE — ED Triage Notes (Signed)
Patient reports that he has numbness and tingling in his left heel.   Patient reports that he was playing basketball and went to heard a pop -- described as a rubber band popping.

## 2021-12-13 ENCOUNTER — Ambulatory Visit: Payer: Medicaid Other | Admitting: Podiatry

## 2021-12-18 ENCOUNTER — Encounter: Payer: Self-pay | Admitting: Podiatry

## 2023-02-12 ENCOUNTER — Other Ambulatory Visit: Payer: Self-pay

## 2023-02-12 ENCOUNTER — Emergency Department
Admission: EM | Admit: 2023-02-12 | Discharge: 2023-02-12 | Disposition: A | Discharge status: Home / Self Care | Payer: MEDICAID | Attending: Emergency Medicine | Admitting: Emergency Medicine

## 2023-02-12 ENCOUNTER — Encounter: Payer: Self-pay | Admitting: Emergency Medicine

## 2023-02-12 ENCOUNTER — Emergency Department: Payer: MEDICAID

## 2023-02-12 DIAGNOSIS — R319 Hematuria, unspecified: Secondary | ICD-10-CM | POA: Insufficient documentation

## 2023-02-12 DIAGNOSIS — R1031 Right lower quadrant pain: Secondary | ICD-10-CM | POA: Insufficient documentation

## 2023-02-12 LAB — URINALYSIS, ROUTINE W REFLEX MICROSCOPIC
Bilirubin Urine: NEGATIVE
Glucose, UA: NEGATIVE mg/dL
Hgb urine dipstick: NEGATIVE
Ketones, ur: NEGATIVE mg/dL
Leukocytes,Ua: NEGATIVE
Nitrite: NEGATIVE
Protein, ur: NEGATIVE mg/dL
Specific Gravity, Urine: 1.021 (ref 1.005–1.030)
pH: 7 (ref 5.0–8.0)

## 2023-02-12 LAB — COMPREHENSIVE METABOLIC PANEL
ALT: 24 U/L (ref 0–44)
AST: 19 U/L (ref 15–41)
Albumin: 4.6 g/dL (ref 3.5–5.0)
Alkaline Phosphatase: 64 U/L (ref 38–126)
Anion gap: 10 (ref 5–15)
BUN: 9 mg/dL (ref 6–20)
CO2: 22 mmol/L (ref 22–32)
Calcium: 9.6 mg/dL (ref 8.9–10.3)
Chloride: 103 mmol/L (ref 98–111)
Creatinine, Ser: 0.92 mg/dL (ref 0.61–1.24)
GFR, Estimated: >60 mL/min (ref >60)
Glucose, Bld: 113 mg/dL — ABNORMAL HIGH (ref 70–99)
Potassium: 3.8 mmol/L (ref 3.5–5.1)
Sodium: 135 mmol/L (ref 135–145)
Total Bilirubin: 1.1 mg/dL (ref <1.2)
Total Protein: 8.4 g/dL — ABNORMAL HIGH (ref 6.5–8.1)

## 2023-02-12 LAB — LIPASE, BLOOD: Lipase: 26 U/L (ref 11–51)

## 2023-02-12 LAB — CBC
HCT: 50.2 % (ref 39.0–52.0)
Hemoglobin: 17.2 g/dL — ABNORMAL HIGH (ref 13.0–17.0)
MCH: 27.5 pg (ref 26.0–34.0)
MCHC: 34.3 g/dL (ref 30.0–36.0)
MCV: 80.3 fL (ref 80.0–100.0)
Platelets: 288 10*3/uL (ref 150–400)
RBC: 6.25 MIL/uL — ABNORMAL HIGH (ref 4.22–5.81)
RDW: 13.9 % (ref 11.5–15.5)
WBC: 6.7 10*3/uL (ref 4.0–10.5)
nRBC: 0 % (ref 0.0–0.2)

## 2023-02-12 MED ORDER — KETOROLAC TROMETHAMINE 30 MG/ML IJ SOLN
30.0000 mg | Freq: Once | INTRAMUSCULAR | Status: AC
  Administered 2023-02-12: 30 mg via INTRAVENOUS
  Filled 2023-02-12: qty 1

## 2023-02-12 MED ORDER — IOHEXOL 350 MG/ML SOLN
100.0000 mL | Freq: Once | INTRAVENOUS | Status: AC | PRN
  Administered 2023-02-12: 100 mL via INTRAVENOUS

## 2023-02-12 MED ORDER — MORPHINE SULFATE (PF) 4 MG/ML IV SOLN
4.0000 mg | Freq: Once | INTRAVENOUS | Status: AC
  Administered 2023-02-12: 4 mg via INTRAVENOUS
  Filled 2023-02-12: qty 1

## 2023-02-12 MED ORDER — ONDANSETRON HCL 4 MG/2ML IJ SOLN
4.0000 mg | Freq: Once | INTRAMUSCULAR | Status: AC
  Administered 2023-02-12: 4 mg via INTRAVENOUS
  Filled 2023-02-12: qty 2

## 2023-02-12 MED ORDER — SODIUM CHLORIDE 0.9 % IV BOLUS
500.0000 mL | Freq: Once | INTRAVENOUS | Status: AC
  Administered 2023-02-12: 500 mL via INTRAVENOUS

## 2023-02-12 NOTE — ED Notes (Signed)
See triage note  Presents with several complaints  Having some right lower back pain which moves into abd   States he has had some blood in his urine. Also having a headache/migraine  States he has been off his meds for about 1 month  He was also taking po pain meds  but rans  Was taking meds from off the street  Last dose was about 5 days ago

## 2023-02-12 NOTE — ED Provider Notes (Signed)
[facility] Provider Note    Event Date/Time   First MD Initiated Contact with Patient 02/12/23 1809     (approximate)   History   Abdominal Pain   HPI  AVRUMI CORELL is a 32 y.o. male who presents with complaints of right flank pain, has noted some hematuria.  This has been ongoing over the last several days.  No history of abdominal surgery     Physical Exam   Triage Vital Signs: ED Triage Vitals  Encounter Vitals Group     BP 02/12/23 1712 (!) 163/132     Systolic BP Percentile --      Diastolic BP Percentile --      Pulse Rate 02/12/23 1712 (!) 112     Resp 02/12/23 1712 18     Temp 02/12/23 1712 99.3 F (37.4 C)     Temp Source 02/12/23 1712 Oral     SpO2 02/12/23 1712 98 %     Weight 02/12/23 1710 127 kg (280 lb)     Height 02/12/23 1710 1.829 m (6')     Head Circumference --      Peak Flow --      Pain Score 02/12/23 1710 8     Pain Loc --      Pain Education --      Exclude from Growth Chart --     Most recent vital signs: Vitals:   02/12/23 1712 02/12/23 2020  BP: (!) 163/132 (!) 175/120  Pulse: (!) 112 95  Resp: 18 17  Temp: 99.3 F (37.4 C) 98.4 F (36.9 C)  SpO2: 98% 99%     General: Awake, no distress.  CV:  Good peripheral perfusion.  Resp:  Normal effort.  Abd:  No distention.  Mild tenderness in the right lower quadrant, right CVA tenderness, mild Other:     ED Results / Procedures / Treatments   Labs (all labs ordered are listed, but only abnormal results are displayed) Labs Reviewed  COMPREHENSIVE METABOLIC PANEL - Abnormal; Notable for the following components:      Result Value   Glucose, Bld 113 (*)    Total Protein 8.4 (*)    All other components within normal limits  CBC - Abnormal; Notable for the following components:   RBC 6.25 (*)    Hemoglobin 17.2 (*)    All other components within normal limits  URINALYSIS, ROUTINE W REFLEX MICROSCOPIC - Abnormal; Notable for the following  components:   Color, Urine YELLOW (*)    APPearance CLEAR (*)    All other components within normal limits  LIPASE, BLOOD     EKG     RADIOLOGY CT abdomen pelvis viewed interpreted by me, no acute abnormality, pending radiology review    PROCEDURES:  Critical Care performed:   Procedures   MEDICATIONS ORDERED IN ED: Medications  morphine (PF) 4 MG/ML injection 4 mg (4 mg Intravenous Given 02/12/23 1844)  ondansetron (ZOFRAN) injection 4 mg (4 mg Intravenous Given 02/12/23 1844)  sodium chloride 0.9 % bolus 500 mL (0 mLs Intravenous Stopped 02/12/23 2001)  iohexol (OMNIPAQUE) 350 MG/ML injection 100 mL (100 mLs Intravenous Contrast Given 02/12/23 1934)  ketorolac (TORADOL) 30 MG/ML injection 30 mg (30 mg Intravenous Given 02/12/23 2010)     IMPRESSION / MDM / ASSESSMENT AND PLAN / ED COURSE  I reviewed the triage vital signs and the nursing notes. Patient's presentation is most consistent with acute presentation with potential threat to life or  bodily function.  Patient presents with right sided pain as detailed above.  Temperature is 99.3, he is tachycardic.  Lab work overall reassuring, normal white blood cell count.  Differential includes appendicitis, kidney stone, UTI/pyelonephritis  Will start IV fluids, IV morphine, IV Zofran  Obtain CT abdomen pelvis and reevaluate  ----------------------------------------- 10:18 PM on 02/12/2023 ----------------------------------------- CT scan demonstrates no acute abnormality, patient is feeling much better, appropriate for discharge at this time, return precautions discussed      FINAL CLINICAL IMPRESSION(S) / ED DIAGNOSES   Final diagnoses:  Right lower quadrant abdominal pain     Rx / DC Orders   ED Discharge Orders     None        Note:  This document was prepared using Dragon voice recognition software and may include unintentional dictation errors.   Jene Every, MD 02/12/23 2219

## 2023-02-12 NOTE — ED Triage Notes (Signed)
Patient to ED via [facility] from home for right lower abd pain. Ongoing x5 days. Patient also reports some blood in urine. Denies N/V/D. Has not been taking prescribed meds in the last few months.

## 2023-06-06 ENCOUNTER — Ambulatory Visit (INDEPENDENT_AMBULATORY_CARE_PROVIDER_SITE_OTHER): Payer: MEDICAID

## 2023-06-06 ENCOUNTER — Ambulatory Visit
Admission: EM | Admit: 2023-06-06 | Discharge: 2023-06-06 | Disposition: A | Discharge status: Home / Self Care | Payer: MEDICAID | Attending: Internal Medicine | Admitting: Internal Medicine

## 2023-06-06 ENCOUNTER — Encounter: Payer: Self-pay | Admitting: Emergency Medicine

## 2023-06-06 DIAGNOSIS — S46209A Unspecified injury of muscle, fascia and tendon of other parts of biceps, unspecified arm, initial encounter: Secondary | ICD-10-CM | POA: Diagnosis not present

## 2023-06-06 DIAGNOSIS — M25512 Pain in left shoulder: Secondary | ICD-10-CM

## 2023-06-06 NOTE — ED Provider Notes (Signed)
 [facility]    CSN: [id number] Arrival date & time: 06/06/23  1478      History   Chief Complaint Chief Complaint  Patient presents with   Shoulder Pain    HPI Jason Collier is a 33 y.o. male who presents with L shoulder pain since yesterday at which time while doing things around the house,  and picking up his daughter off and on, and one of those times when he put her down he heard a "pop" on his anterior shoulder. As time went on noticed pain radiating to his R upper arm, and moving his forearm provokes more pain.  He has used heat and ice and OTC pain meds. Has injured one of his shoulders but does not recall which one    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)    Environmental and seasonal allergies    can lead to brochitis   Kidney infection 2015   history of    Patient Active Problem List   Diagnosis Date Noted   COPD with acute exacerbation (HCC) 02/14/2021   Acute respiratory failure (HCC) 02/14/2021   Asthma 02/14/2021   Influenza A 02/14/2021   Pneumonia and influenza 02/14/2021   Morbid obesity with BMI of 45.0-49.9, adult (HCC) 02/14/2021   Bipolar disorder (HCC) 02/14/2021   SOB (shortness of breath) 11/08/2020   Tobacco abuse 11/08/2020   Depression 11/08/2020   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   Acute bronchitis with bronchospasm 11/07/2020   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   Panic attacks 01/28/2017   Benzodiazepine overdose 01/28/2017   Acute hypoxemic respiratory failure (HCC) 03/19/2016   Encounter for orogastric (OG) tube placement    Aspiration into airway    Neck mass     Past Surgical History:  Procedure Laterality Date   KNEE ARTHROSCOPY WITH LATERAL MENISECTOMY Right 07/26/2015   Procedure: KNEE ARTHROSCOPY WITH LATERAL MENISECTOMY, DEBRIDEMENT OF FAT PAD;  Surgeon: Kennedy Bucker, MD;  Location: [facility] ORS;  Service:  Orthopedics;  Laterality: Right;   NO PAST SURGERIES         Home Medications    Prior to Admission medications   Medication Sig Start Date End Date Taking? Authorizing Provider  amLODipine (NORVASC) 5 MG tablet Take 1 tablet (5 mg total) by mouth daily. 02/17/21   Esaw Grandchild A, DO  divalproex (DEPAKOTE) 500 MG DR tablet 1,500 mg 2 (two) times daily. 06/12/18   [provider]  doxycycline (VIBRAMYCIN) 100 MG capsule Take 1 capsule (100 mg total) by mouth 2 (two) times daily. 09/01/21   Becky Augusta, NP  fluticasone-salmeterol (ADVAIR HFA) [phone] MCG/ACT inhaler Inhale 2 puffs into the lungs 2 (two) times daily. 11/09/20   Marrion Coy, MD  Ipratropium-Albuterol (COMBIVENT RESPIMAT) 20-100 MCG/ACT AERS respimat Inhale 1 puff into the lungs every 6 (six) hours as needed for wheezing. 11/09/20   Marrion Coy, MD  naloxone [facility]) nasal spray 4 mg/0.1 mL Place 4 mg into the nose once. 06/07/20   [provider]  nicotine (NICODERM CQ - DOSED IN MG/24 HOURS) 14 mg/24hr patch Place 1 patch (14 mg total) onto the skin daily as needed (smoking cessation). 11/09/20   Marrion Coy, MD  oxyCODONE-acetaminophen (PERCOCET) 10-325 MG tablet Take 1 tablet by mouth every 4 (four) hours as needed for pain. 09/07/20   [provider]  pantoprazole (PROTONIX) 40 MG tablet Take 40 mg by mouth  daily. 07/31/21   [provider]  clonazePAM (KLONOPIN) 1 MG tablet Take 1 mg by mouth at bedtime as needed for anxiety.  02/11/20  [provider]  CLONIDINE HCL PO Take 1 tablet by mouth 2 (two) times daily as needed (anxiety). Reported on 07/26/2015  08/23/19  [provider]  FLUoxetine (PROZAC) 20 MG tablet Take 20 mg by mouth daily.  08/23/19  [provider]  QUEtiapine (SEROQUEL) 50 MG tablet Take 2 tablets (100 mg total) by mouth at bedtime. Take half pill up to twice a day as needed for anxiety.  Take 2 pills regularly every night for sleep and mood. 01/28/17  08/23/19  Clapacs, Jackquline Denmark, MD  sertraline (ZOLOFT) 50 MG tablet Take 50 mg by mouth daily. Reported on 07/26/2015  08/23/19  [provider]    Family History Family History  Problem Relation Age of Onset   Anxiety disorder Mother     Social History Social History   Tobacco Use   Smoking status: Every Day    Current packs/day: 0.50    Types: Cigarettes   Smokeless tobacco: Former  Building services engineer status: Never Used  Substance Use Topics   Alcohol use: Yes    Alcohol/week: 2.0 standard drinks of alcohol    Types: 2 Cans of beer per week    Comment: every three days   Drug use: No    Types: Marijuana    Comment: over 1 year ago     Allergies   Tramadol, Hydrocodone, Dexamethasone, Escitalopram, Hydrocodone, Hydroxyzine hcl, Methocarbamol, Montelukast, Sertraline, Tramadol, Fentanyl, and Quetiapine   Review of Systems Review of Systems As noted in HPI  Physical Exam Triage Vital Signs ED Triage Vitals  Encounter Vitals Group     BP 06/06/23 1905 (!) 178/119     Systolic BP Percentile --      Diastolic BP Percentile --      Pulse Rate 06/06/23 1905 85     Resp 06/06/23 1905 18     Temp 06/06/23 1905 99.2 F (37.3 C)     Temp Source 06/06/23 1905 Oral     SpO2 06/06/23 1905 96 %     Weight --      Height --      Head Circumference --      Peak Flow --      Pain Score 06/06/23 1903 7     Pain Loc --      Pain Education --      Exclude from Growth Chart --    No data found.  Updated Vital Signs BP (!) 159/113 (BP Location: Right Arm)   Pulse 85   Temp 99.2 F (37.3 C) (Oral)   Resp 18   SpO2 96%   Visual Acuity Right Eye Distance:   Left Eye Distance:   Bilateral Distance:    Right Eye Near:   Left Eye Near:    Bilateral Near:     Physical Exam Vitals and nursing note reviewed.  Constitutional:      General: He is not in acute distress.    Appearance: He is obese. He is not toxic-appearing.  HENT:     Right Ear: External ear  normal.     Left Ear: External ear normal.  Eyes:     General: No scleral icterus.    Conjunctiva/sclera: Conjunctivae normal.  Pulmonary:     Effort: Pulmonary effort is normal.  Musculoskeletal:     Cervical back: Neck  supple.     Comments: L SHOULDER- has swelling on distal clavicle where he is tender with palpation. Has decreased ROM due to pain. Has tenderness below the bicep tendon distally and on the bicep muscle. Strength of bicep is 3/5  He does not have obvious deformity, but the bicep muscle seems smaller than the R   Skin:    General: Skin is warm and dry.     Capillary Refill: Capillary refill takes less than 2 seconds.  Neurological:     Mental Status: He is alert and oriented to person, place, and time.     Gait: Gait normal.  Psychiatric:        Mood and Affect: Mood normal.        Behavior: Behavior normal.        Thought Content: Thought content normal.        Judgment: Judgment normal.      UC Treatments / Results  Labs (all labs ordered are listed, but only abnormal results are displayed) Labs Reviewed - No data to display  EKG   Radiology DG Shoulder Left Result Date: 06/06/2023 CLINICAL DATA:  Left shoulder pain after possible injury. EXAM: LEFT SHOULDER - 2+ VIEW COMPARISON:  None Available. FINDINGS: There is no evidence of fracture or dislocation. There is no evidence of arthropathy or other focal bone abnormality. Soft tissues are unremarkable. IMPRESSION: Negative. Electronically Signed   By: Lupita Raider M.D.   On: 06/06/2023 20:05    Procedures Procedures (including critical care time)  Medications Ordered in UC Medications - No data to display  Initial Impression / Assessment and Plan / UC Course  I have reviewed the triage vital signs and the nursing notes.  Pertinent  imaging results that were available during my care of the patient were reviewed by me and considered in my medical decision making (see chart for details).  L shoulder  strain L bicep injury, I am concerned he tore it.   He was placed on a sling and advised to FU with ortho tomorrow.     Final Clinical Impressions(s) / UC Diagnoses   Final diagnoses:  Acute pain of left shoulder  Injury of biceps brachii muscle     Discharge Instructions      Only use ice on area of pain for 20 minutes 3-4 times a day for 2 days Follow up with ortho tomorrow      ED Prescriptions   None    PDMP not reviewed this encounter.   Garey Ham, [place] 06/06/23 2026

## 2023-06-06 NOTE — Discharge Instructions (Addendum)
 Only use ice on area of pain for 20 minutes 3-4 times a day for 2 days Follow up with ortho tomorrow

## 2023-06-06 NOTE — ED Triage Notes (Signed)
 Pt states he was doing things around the house yesterday and heard a "pop" in his left shoulder. He has tingling in his left hand as well. Pt has used heat/ice and OTC pain medication for his symptoms.
